# Patient Record
Sex: Female | Born: 1960 | Race: White | Hispanic: No | Marital: Married | Smoking: Former smoker
Health system: Southern US, Community
[De-identification: ages and names within clinical notes are randomized; demographics above are authoritative.]

## PROBLEM LIST (undated history)

## (undated) DIAGNOSIS — F32A Depression, unspecified: Secondary | ICD-10-CM

## (undated) DIAGNOSIS — F419 Anxiety disorder, unspecified: Secondary | ICD-10-CM

## (undated) DIAGNOSIS — T7840XA Allergy, unspecified, initial encounter: Secondary | ICD-10-CM

## (undated) DIAGNOSIS — R7611 Nonspecific reaction to tuberculin skin test without active tuberculosis: Secondary | ICD-10-CM

## (undated) DIAGNOSIS — M1990 Unspecified osteoarthritis, unspecified site: Secondary | ICD-10-CM

## (undated) DIAGNOSIS — F329 Major depressive disorder, single episode, unspecified: Secondary | ICD-10-CM

## (undated) DIAGNOSIS — D649 Anemia, unspecified: Secondary | ICD-10-CM

## (undated) DIAGNOSIS — R519 Headache, unspecified: Secondary | ICD-10-CM

## (undated) DIAGNOSIS — R51 Headache: Secondary | ICD-10-CM

## (undated) HISTORY — DX: Unspecified osteoarthritis, unspecified site: M19.90

## (undated) HISTORY — DX: Headache, unspecified: R51.9

## (undated) HISTORY — PX: OTHER SURGICAL HISTORY: SHX169

## (undated) HISTORY — DX: Headache: R51

## (undated) HISTORY — DX: Major depressive disorder, single episode, unspecified: F32.9

## (undated) HISTORY — DX: Allergy, unspecified, initial encounter: T78.40XA

## (undated) HISTORY — PX: WISDOM TOOTH EXTRACTION: SHX21

## (undated) HISTORY — DX: Anemia, unspecified: D64.9

## (undated) HISTORY — DX: Depression, unspecified: F32.A

## (undated) HISTORY — DX: Nonspecific reaction to tuberculin skin test without active tuberculosis: R76.11

## (undated) HISTORY — DX: Anxiety disorder, unspecified: F41.9

---
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
## 1995-01-17 HISTORY — PX: TUBAL LIGATION: SHX77

## 2002-04-29 ENCOUNTER — Other Ambulatory Visit: Admission: RE | Admit: 2002-04-29 | Discharge: 2002-04-29 | Payer: Self-pay | Admitting: Obstetrics and Gynecology

## 2003-12-08 ENCOUNTER — Emergency Department (HOSPITAL_COMMUNITY): Admission: EM | Admit: 2003-12-08 | Discharge: 2003-12-08 | Payer: Self-pay | Admitting: Emergency Medicine

## 2006-12-05 ENCOUNTER — Other Ambulatory Visit: Admission: RE | Admit: 2006-12-05 | Discharge: 2006-12-05 | Payer: Self-pay | Admitting: Gynecology

## 2010-12-20 ENCOUNTER — Other Ambulatory Visit: Payer: Self-pay | Admitting: Family Medicine

## 2010-12-20 ENCOUNTER — Other Ambulatory Visit (HOSPITAL_COMMUNITY)
Admission: RE | Admit: 2010-12-20 | Discharge: 2010-12-20 | Disposition: A | Discharge status: Home / Self Care | Payer: 59 | Source: Ambulatory Visit | Attending: Family Medicine | Admitting: Family Medicine

## 2010-12-20 DIAGNOSIS — Z124 Encounter for screening for malignant neoplasm of cervix: Secondary | ICD-10-CM | POA: Insufficient documentation

## 2012-02-15 ENCOUNTER — Other Ambulatory Visit: Payer: Self-pay | Admitting: Family Medicine

## 2012-02-15 ENCOUNTER — Other Ambulatory Visit (HOSPITAL_COMMUNITY)
Admission: RE | Admit: 2012-02-15 | Discharge: 2012-02-15 | Disposition: A | Discharge status: Home / Self Care | Payer: 59 | Source: Ambulatory Visit | Attending: Family Medicine | Admitting: Family Medicine

## 2012-02-15 DIAGNOSIS — Z124 Encounter for screening for malignant neoplasm of cervix: Secondary | ICD-10-CM | POA: Insufficient documentation

## 2013-04-03 ENCOUNTER — Ambulatory Visit
Admission: RE | Admit: 2013-04-03 | Discharge: 2013-04-03 | Disposition: A | Discharge status: Home / Self Care | Payer: 59 | Source: Ambulatory Visit | Attending: Family Medicine | Admitting: Family Medicine

## 2013-04-03 ENCOUNTER — Other Ambulatory Visit: Payer: Self-pay | Admitting: Family Medicine

## 2013-04-03 DIAGNOSIS — M25569 Pain in unspecified knee: Secondary | ICD-10-CM

## 2014-04-06 ENCOUNTER — Other Ambulatory Visit (HOSPITAL_COMMUNITY)
Admission: RE | Admit: 2014-04-06 | Discharge: 2014-04-06 | Disposition: A | Discharge status: Home / Self Care | Payer: 59 | Source: Ambulatory Visit | Attending: Family Medicine | Admitting: Family Medicine

## 2014-04-06 ENCOUNTER — Other Ambulatory Visit: Payer: Self-pay | Admitting: Family Medicine

## 2014-04-06 DIAGNOSIS — Z124 Encounter for screening for malignant neoplasm of cervix: Secondary | ICD-10-CM | POA: Diagnosis not present

## 2014-04-08 LAB — CYTOLOGY - PAP

## 2016-04-24 ENCOUNTER — Ambulatory Visit (INDEPENDENT_AMBULATORY_CARE_PROVIDER_SITE_OTHER): Payer: 59 | Admitting: Family Medicine

## 2016-04-24 ENCOUNTER — Encounter: Payer: Self-pay | Admitting: Family Medicine

## 2016-04-24 VITALS — BP 108/70 | HR 56 | Temp 97.8°F | Ht 66.0 in | Wt 173.8 lb

## 2016-04-24 DIAGNOSIS — F329 Major depressive disorder, single episode, unspecified: Secondary | ICD-10-CM

## 2016-04-24 DIAGNOSIS — F419 Anxiety disorder, unspecified: Principal | ICD-10-CM

## 2016-04-24 DIAGNOSIS — M1990 Unspecified osteoarthritis, unspecified site: Secondary | ICD-10-CM | POA: Insufficient documentation

## 2016-04-24 DIAGNOSIS — F418 Other specified anxiety disorders: Secondary | ICD-10-CM | POA: Diagnosis not present

## 2016-04-24 DIAGNOSIS — G43909 Migraine, unspecified, not intractable, without status migrainosus: Secondary | ICD-10-CM | POA: Insufficient documentation

## 2016-04-24 DIAGNOSIS — Z7689 Persons encountering health services in other specified circumstances: Secondary | ICD-10-CM | POA: Diagnosis not present

## 2016-04-24 NOTE — Patient Instructions (Addendum)
It was a pleasure to see you today! Please schedule a physical at your convenience and we will obtain lab work at that time. Also, please call for any refills needed prior to your physical.  Health Maintenance, Female Adopting a healthy lifestyle and getting preventive care can go a long way to promote health and wellness. Talk with your health care provider about what schedule of regular examinations is right for you. This is a good chance for you to check in with your provider about disease prevention and staying healthy. In between checkups, there are plenty of things you can do on your own. Experts have done a lot of research about which lifestyle changes and preventive measures are most likely to keep you healthy. Ask your health care provider for more information. Weight and diet Eat a healthy diet  Be sure to include plenty of vegetables, fruits, low-fat dairy products, and lean protein.  Do not eat a lot of foods high in solid fats, added sugars, or salt.  Get regular exercise. This is one of the most important things you can do for your health.  Most adults should exercise for at least 150 minutes each week. The exercise should increase your heart rate and make you sweat (moderate-intensity exercise).  Most adults should also do strengthening exercises at least twice a week. This is in addition to the moderate-intensity exercise. Maintain a healthy weight  Body mass index (BMI) is a measurement that can be used to identify possible weight problems. It estimates body fat based on height and weight. Your health care provider can help determine your BMI and help you achieve or maintain a healthy weight.  For females 9 years of age and older:  A BMI below 18.5 is considered underweight.  A BMI of 18.5 to 24.9 is normal.  A BMI of 25 to 29.9 is considered overweight.  A BMI of 30 and above is considered obese. Watch levels of cholesterol and blood lipids  You should start having  your blood tested for lipids and cholesterol at 55 years of age, then have this test every 5 years.  You may need to have your cholesterol levels checked more often if:  Your lipid or cholesterol levels are high.  You are older than 55 years of age.  You are at high risk for heart disease. Cancer screening Lung Cancer  Lung cancer screening is recommended for adults 92-25 years old who are at high risk for lung cancer because of a history of smoking.  A yearly low-dose CT scan of the lungs is recommended for people who:  Currently smoke.  Have quit within the past 15 years.  Have at least a 30-pack-year history of smoking. A pack year is smoking an average of one pack of cigarettes a day for 1 year.  Yearly screening should continue until it has been 15 years since you quit.  Yearly screening should stop if you develop a health problem that would prevent you from having lung cancer treatment. Breast Cancer  Practice breast self-awareness. This means understanding how your breasts normally appear and feel.  It also means doing regular breast self-exams. Let your health care provider know about any changes, no matter how small.  If you are in your 20s or 30s, you should have a clinical breast exam (CBE) by a health care provider every 1-3 years as part of a regular health exam.  If you are 83 or older, have a CBE every year. Also consider having  a breast X-ray (mammogram) every year.  If you have a family history of breast cancer, talk to your health care provider about genetic screening.  If you are at high risk for breast cancer, talk to your health care provider about having an MRI and a mammogram every year.  Breast cancer gene (BRCA) assessment is recommended for women who have family members with BRCA-related cancers. BRCA-related cancers include:  Breast.  Ovarian.  Tubal.  Peritoneal cancers.  Results of the assessment will determine the need for genetic  counseling and BRCA1 and BRCA2 testing. Cervical Cancer  Your health care provider may recommend that you be screened regularly for cancer of the pelvic organs (ovaries, uterus, and vagina). This screening involves a pelvic examination, including checking for microscopic changes to the surface of your cervix (Pap test). You may be encouraged to have this screening done every 3 years, beginning at age 10.  For women ages 48-65, health care providers may recommend pelvic exams and Pap testing every 3 years, or they may recommend the Pap and pelvic exam, combined with testing for human papilloma virus (HPV), every 5 years. Some types of HPV increase your risk of cervical cancer. Testing for HPV may also be done on women of any age with unclear Pap test results.  Other health care providers may not recommend any screening for nonpregnant women who are considered low risk for pelvic cancer and who do not have symptoms. Ask your health care provider if a screening pelvic exam is right for you.  If you have had past treatment for cervical cancer or a condition that could lead to cancer, you need Pap tests and screening for cancer for at least 20 years after your treatment. If Pap tests have been discontinued, your risk factors (such as having a new sexual partner) need to be reassessed to determine if screening should resume. Some women have medical problems that increase the chance of getting cervical cancer. In these cases, your health care provider may recommend more frequent screening and Pap tests. Colorectal Cancer  This type of cancer can be detected and often prevented.  Routine colorectal cancer screening usually begins at 55 years of age and continues through 55 years of age.  Your health care provider may recommend screening at an earlier age if you have risk factors for colon cancer.  Your health care provider may also recommend using home test kits to check for hidden blood in the stool.  A  small camera at the end of a tube can be used to examine your colon directly (sigmoidoscopy or colonoscopy). This is done to check for the earliest forms of colorectal cancer.  Routine screening usually begins at age 57.  Direct examination of the colon should be repeated every 5-10 years through 55 years of age. However, you may need to be screened more often if early forms of precancerous polyps or small growths are found. Skin Cancer  Check your skin from head to toe regularly.  Tell your health care provider about any new moles or changes in moles, especially if there is a change in a mole's shape or color.  Also tell your health care provider if you have a mole that is larger than the size of a pencil eraser.  Always use sunscreen. Apply sunscreen liberally and repeatedly throughout the day.  Protect yourself by wearing long sleeves, pants, a wide-brimmed hat, and sunglasses whenever you are outside. Heart disease, diabetes, and high blood pressure  High blood pressure  causes heart disease and increases the risk of stroke. High blood pressure is more likely to develop in:  People who have blood pressure in the high end of the normal range (130-139/85-89 mm Hg).  People who are overweight or obese.  People who are African American.  If you are 23-25 years of age, have your blood pressure checked every 3-5 years. If you are 58 years of age or older, have your blood pressure checked every year. You should have your blood pressure measured twice-once when you are at a hospital or clinic, and once when you are not at a hospital or clinic. Record the average of the two measurements. To check your blood pressure when you are not at a hospital or clinic, you can use:  An automated blood pressure machine at a pharmacy.  A home blood pressure monitor.  If you are between 46 years and 46 years old, ask your health care provider if you should take aspirin to prevent strokes.  Have regular  diabetes screenings. This involves taking a blood sample to check your fasting blood sugar level.  If you are at a normal weight and have a low risk for diabetes, have this test once every three years after 55 years of age.  If you are overweight and have a high risk for diabetes, consider being tested at a younger age or more often. Preventing infection Hepatitis B  If you have a higher risk for hepatitis B, you should be screened for this virus. You are considered at high risk for hepatitis B if:  You were born in a country where hepatitis B is common. Ask your health care provider which countries are considered high risk.  Your parents were born in a high-risk country, and you have not been immunized against hepatitis B (hepatitis B vaccine).  You have HIV or AIDS.  You use needles to inject street drugs.  You live with someone who has hepatitis B.  You have had sex with someone who has hepatitis B.  You get hemodialysis treatment.  You take certain medicines for conditions, including cancer, organ transplantation, and autoimmune conditions. Hepatitis C  Blood testing is recommended for:  Everyone born from [birth year] through 1964.  Anyone with known risk factors for hepatitis C. Sexually transmitted infections (STIs)  You should be screened for sexually transmitted infections (STIs) including gonorrhea and chlamydia if:  You are sexually active and are younger than 55 years of age.  You are older than 55 years of age and your health care provider tells you that you are at risk for this type of infection.  Your sexual activity has changed since you were last screened and you are at an increased risk for chlamydia or gonorrhea. Ask your health care provider if you are at risk.  If you do not have HIV, but are at risk, it may be recommended that you take a prescription medicine daily to prevent HIV infection. This is called pre-exposure prophylaxis (PrEP). You are considered at  risk if:  You are sexually active and do not regularly use condoms or know the HIV status of your partner(s).  You take drugs by injection.  You are sexually active with a partner who has HIV. Talk with your health care provider about whether you are at high risk of being infected with HIV. If you choose to begin PrEP, you should first be tested for HIV. You should then be tested every 3 months for as long as you are  taking PrEP. Pregnancy  If you are premenopausal and you may become pregnant, ask your health care provider about preconception counseling.  If you may become pregnant, take 400 to 800 micrograms (mcg) of folic acid every day.  If you want to prevent pregnancy, talk to your health care provider about birth control (contraception). Osteoporosis and menopause  Osteoporosis is a disease in which the bones lose minerals and strength with aging. This can result in serious bone fractures. Your risk for osteoporosis can be identified using a bone density scan.  If you are 21 years of age or older, or if you are at risk for osteoporosis and fractures, ask your health care provider if you should be screened.  Ask your health care provider whether you should take a calcium or vitamin D supplement to lower your risk for osteoporosis.  Menopause may have certain physical symptoms and risks.  Hormone replacement therapy may reduce some of these symptoms and risks. Talk to your health care provider about whether hormone replacement therapy is right for you. Follow these instructions at home:  Schedule regular health, dental, and eye exams.  Stay current with your immunizations.  Do not use any tobacco products including cigarettes, chewing tobacco, or electronic cigarettes.  If you are pregnant, do not drink alcohol.  If you are breastfeeding, limit how much and how often you drink alcohol.  Limit alcohol intake to no more than 1 drink per day for nonpregnant women. One drink  equals 12 ounces of beer, 5 ounces of wine, or 1 ounces of hard liquor.  Do not use street drugs.  Do not share needles.  Ask your health care provider for help if you need support or information about quitting drugs.  Tell your health care provider if you often feel depressed.  Tell your health care provider if you have ever been abused or do not feel safe at home. This information is not intended to replace advice given to you by your health care provider. Make sure you discuss any questions you have with your health care provider. Document Released: 07/18/2010 Document Revised: 06/10/2015 Document Reviewed: 10/06/2014 Elsevier Interactive Patient Education  2017 [place] NOW OFFER   [facility] FAST TRACK!!!  SAME DAY Appointments for ACUTE CARE  Such as: Sprains, Injuries, cuts, abrasions, rashes, muscle pain, joint pain, back pain Colds, flu, sore throats, headache, allergies, cough, fever  Ear pain, sinus and eye infections Abdominal pain, nausea, vomiting, diarrhea, upset stomach Animal/insect bites  3 Easy Ways to Schedule: Walk-In Scheduling Call in scheduling Mychart Sign-up: [email or web address]

## 2016-04-24 NOTE — Progress Notes (Signed)
Pre visit review using our clinic review tool, if applicable. No additional management support is needed unless otherwise documented below in the visit note. 

## 2016-04-24 NOTE — Progress Notes (Signed)
Patient ID: Sandra Mccarthy, female   DOB: November 05, 1960, 56 y.o.   MRN: [id number]  Patient presents to clinic today to establish care. She reports that she is interested in being healthy and would like to work on weight loss and have a physical in the near future.  Chronic Issues: Anxiety and depression which are controlled with escitalopram that she has been taking for approximately 6 years per patient. She denies changes in appetite, sleep, and denies any history of suicidal or homicidal ideation.   Arthritis has been treated with meloxicam as needed that has provided excellent benefit per patient.  She reports a remote history of diverticulitis however she did not remember treatment for this and reports that she has never had a colonoscopy.   History of frequent headaches however she denies any recent episodes of headaches or treatment.   Health Maintenance: Dental -- Twice yearly; UTD Vision -- Yearly; Lasik surgery and glasses with driving Immunizations -- She will obtain records for tetanus. No influenza vaccine at this time.   Colonoscopy -- Screening needed Mammogram -- Two years ago per patient; no abnormal findings PAP -- 2016  Diet and Exercise.  Modified heart healthy diet and she is working on weight loss; wears a fit bit and working to increase steps and initiate weight training.    Review of Systems  Constitutional: Negative for chills.  HENT: Negative for congestion.   Respiratory: Negative for cough, sputum production, shortness of breath and wheezing.   Cardiovascular: Negative for chest pain, palpitations and leg swelling.  Gastrointestinal: Negative for abdominal pain, blood in stool, constipation, diarrhea, heartburn, nausea and vomiting.  Genitourinary: Negative for dysuria, frequency, hematuria and urgency.  Skin: Negative for rash.  Neurological: Negative for dizziness, tingling, weakness and headaches.  Psychiatric/Behavioral:       Denies depressed or anxious  mood   Past Medical History:  Diagnosis Date  . Arthritis   . Depression   . Diverticulitis   . Frequent headaches   . 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 Urinary tract infection      Social History   Social History  . Marital status: Married    Spouse name: N/A  . Number of children: N/A  . Years of education: N/A   Occupational History  . Not on file.   Social History Main Topics  . Smoking status: Former Games developer  . Smokeless tobacco: Former Neurosurgeon  . Alcohol use Yes     Comment: occassional  . Drug use: No  . Sexual activity: Yes    Birth control/ protection: Post-menopausal   Other Topics Concern  . Not on file   Social History Narrative  . No narrative on file    Past Surgical History:  Procedure Laterality Date  . cystoscope Ivp Bilateral [id number]  . TUBAL LIGATION Bilateral 1997    Family History  Problem Relation Age of Onset  . Arthritis Mother   . Hypertension Father   . Diabetes Maternal Grandmother   . Birth defects Maternal Grandfather     No Known Allergies  No current outpatient prescriptions on file prior to visit.   No current facility-administered medications on file prior to visit.     BP 108/70 (BP Location: Left Arm, Patient Position: Sitting, Cuff Size: Normal)   Pulse (!) 56   Temp 97.8 F (36.6 C) (Oral)   Ht  (1.676 m)   Wt 173 lb 12.8 oz (78.8 kg)   LMP 03/24/2016 Comment:  tubal ligation  SpO2 97%   BMI 28.05 kg/m     BP 108/70 (BP Location: Left Arm, Patient Position: Sitting, Cuff Size: Normal)   Pulse (!) 56   Temp 97.8 F (36.6 C) (Oral)   Ht  (1.676 m)   Wt 173 lb 12.8 oz (78.8 kg)   LMP 03/24/2016 Comment:  tubal ligation  SpO2 97%   BMI 28.05 kg/m   Physical Exam  Constitutional: She is oriented to person, place, and time and well-developed, well-nourished, and in no distress.  Eyes: Pupils are equal, round, and reactive to light. No scleral icterus.  Neck: Neck supple.  Cardiovascular: Normal rate,  regular rhythm and intact distal pulses.   Pulmonary/Chest: Effort normal and breath sounds normal. She has no wheezes. She has no rales.  Abdominal: Soft. Bowel sounds are normal. There is no tenderness.  Musculoskeletal: She exhibits no edema.  Lymphadenopathy:    She has no cervical adenopathy.  Neurological: She is alert and oriented to person, place, and time. Gait normal. Coordination normal.  Skin: Skin is warm and dry. No rash noted.  Psychiatric: Mood, memory, affect and judgment normal.     Assessment/Plan:  1. Anxiety and depression Controlled; Continue escitalopram as directed;    2. Arthritis Meloxicam as needed; we discussed importance of obtaining lab work with physical for long term management is needed.   3. Encounter to establish care We reviewed the PMH, PSH, FH, SH, Meds and Allergies. -We provided refills for any medications we will prescribe as needed. -We addressed current concerns per orders and patient instructions. -We have asked for records for pertinent exams, studies, vaccines and notes from previous providers. -We have advised patient to follow up per instructions below.   -Patient advised to return or notify a provider immediately if symptoms worsen or persist or new concerns arise.  Recommend scheduling for physical and lab work for preventive care. Advised her that she is due for mammogram and screening for colon cancer.  Roddie Mc, FNP-C

## 2016-05-15 ENCOUNTER — Encounter: Payer: 59 | Admitting: Family Medicine

## 2016-05-15 NOTE — Progress Notes (Deleted)
   Subjective:    Patient ID: Sandra Mccarthy, female    DOB: October 25, 1960, 56 y.o.   MRN: [id number]  HPI  Sandra Mccarthy is a 56 year old female who is here for a yearly physical examination.  She has a history of arthritis and anxiety/depression. Depression and anxiety is controlled with escitalopram.  Her last Pap 03/2014  was normal. She is due for a mammogram and screening colonoscopy.   Diet:  Exercise:  Today, she reports    Review of past medical records indicates that she has had a Tdap 02/05/2012  Review of Systems     Objective:   Physical Exam        Assessment & Plan:

## 2016-05-17 ENCOUNTER — Ambulatory Visit (INDEPENDENT_AMBULATORY_CARE_PROVIDER_SITE_OTHER): Payer: 59 | Admitting: Family Medicine

## 2016-05-17 ENCOUNTER — Encounter: Payer: Self-pay | Admitting: Family Medicine

## 2016-05-17 VITALS — BP 98/70 | HR 70 | Temp 98.2°F | Ht 66.0 in | Wt 167.6 lb

## 2016-05-17 DIAGNOSIS — Z1231 Encounter for screening mammogram for malignant neoplasm of breast: Secondary | ICD-10-CM | POA: Diagnosis not present

## 2016-05-17 DIAGNOSIS — Z1211 Encounter for screening for malignant neoplasm of colon: Secondary | ICD-10-CM

## 2016-05-17 DIAGNOSIS — M1990 Unspecified osteoarthritis, unspecified site: Secondary | ICD-10-CM | POA: Diagnosis not present

## 2016-05-17 DIAGNOSIS — Z0000 Encounter for general adult medical examination without abnormal findings: Secondary | ICD-10-CM | POA: Diagnosis not present

## 2016-05-17 DIAGNOSIS — F329 Major depressive disorder, single episode, unspecified: Secondary | ICD-10-CM | POA: Diagnosis not present

## 2016-05-17 DIAGNOSIS — F419 Anxiety disorder, unspecified: Secondary | ICD-10-CM

## 2016-05-17 DIAGNOSIS — Z1239 Encounter for other screening for malignant neoplasm of breast: Secondary | ICD-10-CM

## 2016-05-17 DIAGNOSIS — Z23 Encounter for immunization: Secondary | ICD-10-CM | POA: Diagnosis not present

## 2016-05-17 LAB — LIPID PANEL
Cholesterol: 224 mg/dL — ABNORMAL HIGH (ref 0–200)
HDL: 67.7 mg/dL (ref >39.00)
LDL Cholesterol: 143 mg/dL — ABNORMAL HIGH (ref 0–99)
NonHDL: 156.02
Total CHOL/HDL Ratio: 3
Triglycerides: 66 mg/dL (ref 0.0–149.0)
VLDL: 13.2 mg/dL (ref 0.0–40.0)

## 2016-05-17 LAB — CBC WITH DIFFERENTIAL/PLATELET
Basophils Absolute: 0 10*3/uL (ref 0.0–0.1)
Basophils Relative: 0.2 % (ref 0.0–3.0)
Eosinophils Absolute: 0 10*3/uL (ref 0.0–0.7)
Eosinophils Relative: 0.4 % (ref 0.0–5.0)
HCT: 39.3 % (ref 36.0–46.0)
HEMOGLOBIN: 13.5 g/dL (ref 12.0–15.0)
LYMPHS PCT: 24.8 % (ref 12.0–46.0)
Lymphs Abs: 1.7 10*3/uL (ref 0.7–4.0)
MCHC: 34.3 g/dL (ref 30.0–36.0)
MCV: 92.8 fl (ref 78.0–100.0)
Monocytes Absolute: 0.4 10*3/uL (ref 0.1–1.0)
Monocytes Relative: 6.4 % (ref 3.0–12.0)
Neutro Abs: 4.8 10*3/uL (ref 1.4–7.7)
Neutrophils Relative %: 68.2 % (ref 43.0–77.0)
Platelets: 300 10*3/uL (ref 150.0–400.0)
RBC: 4.24 Mil/uL (ref 3.87–5.11)
RDW: 12.5 % (ref 11.5–15.5)
WBC: 7 10*3/uL (ref 4.0–10.5)

## 2016-05-17 LAB — BASIC METABOLIC PANEL
BUN: 19 mg/dL (ref 6–23)
CHLORIDE: 103 meq/L (ref 96–112)
CO2: 27 meq/L (ref 19–32)
Calcium: 9.7 mg/dL (ref 8.4–10.5)
Creatinine, Ser: 0.76 mg/dL (ref 0.40–1.20)
GFR: 84.08 mL/min (ref >60.00)
Glucose, Bld: 85 mg/dL (ref 70–99)
Potassium: 4.1 mEq/L (ref 3.5–5.1)
Sodium: 138 mEq/L (ref 135–145)

## 2016-05-17 LAB — URINALYSIS, ROUTINE W REFLEX MICROSCOPIC
BILIRUBIN URINE: NEGATIVE
Ketones, ur: NEGATIVE
Leukocytes, UA: NEGATIVE
NITRITE: NEGATIVE
Specific Gravity, Urine: 1.02 (ref 1.000–1.030)
TOTAL PROTEIN, URINE-UPE24: NEGATIVE
URINE GLUCOSE: NEGATIVE
Urobilinogen, UA: 0.2 (ref 0.0–1.0)
pH: 6.5 (ref 5.0–8.0)

## 2016-05-17 LAB — HEPATIC FUNCTION PANEL
ALBUMIN: 4.5 g/dL (ref 3.5–5.2)
ALT: 18 U/L (ref 0–35)
AST: 22 U/L (ref 0–37)
Alkaline Phosphatase: 74 U/L (ref 39–117)
Bilirubin, Direct: 0.2 mg/dL (ref 0.0–0.3)
TOTAL PROTEIN: 7 g/dL (ref 6.0–8.3)
Total Bilirubin: 0.9 mg/dL (ref 0.2–1.2)

## 2016-05-17 LAB — TSH: TSH: 0.91 u[IU]/mL (ref 0.35–4.50)

## 2016-05-17 LAB — HEMOGLOBIN A1C: HEMOGLOBIN A1C: 5.4 % (ref 4.6–6.5)

## 2016-05-17 NOTE — Patient Instructions (Addendum)
It was a pleasure to see you today!  Keep up the great work with diet and exercise!  We have ordered labs or studies at this visit. It can take up to 1-2 weeks for results and processing. IF results require follow up or explanation, we will call you with instructions. Clinically stable results will be released to your [facility]. If you have not heard from [place] or cannot find your results in [facility] in 2 weeks please contact our office at [phone].  If you are not yet signed up for [facility], please consider signing up   Health Maintenance, Female Adopting a healthy lifestyle and getting preventive care can go a long way to promote health and wellness. Talk with your health care provider about what schedule of regular examinations is right for you. This is a good chance for you to check in with your provider about disease prevention and staying healthy. In between checkups, there are plenty of things you can do on your own. Experts have done a lot of research about which lifestyle changes and preventive measures are most likely to keep you healthy. Ask your health care provider for more information. Weight and diet Eat a healthy diet  Be sure to include plenty of vegetables, fruits, low-fat dairy products, and lean protein.  Do not eat a lot of foods high in solid fats, added sugars, or salt.  Get regular exercise. This is one of the most important things you can do for your health.  Most adults should exercise for at least 150 minutes each week. The exercise should increase your heart rate and make you sweat (moderate-intensity exercise).  Most adults should also do strengthening exercises at least twice a week. This is in addition to the moderate-intensity exercise. Maintain a healthy weight  Body mass index (BMI) is a measurement that can be used to identify possible weight problems. It estimates body fat based on height and weight. Your health care provider can help determine your BMI and help  you achieve or maintain a healthy weight.  For females 30 years of age and older:  A BMI below 18.5 is considered underweight.  A BMI of 18.5 to 24.9 is normal.  A BMI of 25 to 29.9 is considered overweight.  A BMI of 30 and above is considered obese. Watch levels of cholesterol and blood lipids  You should start having your blood tested for lipids and cholesterol at 54 years of age, then have this test every 5 years.  You may need to have your cholesterol levels checked more often if:  Your lipid or cholesterol levels are high.  You are older than 55 years of age.  You are at high risk for heart disease. Cancer screening Lung Cancer  Lung cancer screening is recommended for adults 60-59 years old who are at high risk for lung cancer because of a history of smoking.  A yearly low-dose CT scan of the lungs is recommended for people who:  Currently smoke.  Have quit within the past 15 years.  Have at least a 30-pack-year history of smoking. A pack year is smoking an average of one pack of cigarettes a day for 1 year.  Yearly screening should continue until it has been 15 years since you quit.  Yearly screening should stop if you develop a health problem that would prevent you from having lung cancer treatment. Breast Cancer  Practice breast self-awareness. This means understanding how your breasts normally appear and feel.  It also means  doing regular breast self-exams. Let your health care provider know about any changes, no matter how small.  If you are in your 20s or 30s, you should have a clinical breast exam (CBE) by a health care provider every 1-3 years as part of a regular health exam.  If you are 32 or older, have a CBE every year. Also consider having a breast X-ray (mammogram) every year.  If you have a family history of breast cancer, talk to your health care provider about genetic screening.  If you are at high risk for breast cancer, talk to your health  care provider about having an MRI and a mammogram every year.  Breast cancer gene (BRCA) assessment is recommended for women who have family members with BRCA-related cancers. BRCA-related cancers include:  Breast.  Ovarian.  Tubal.  Peritoneal cancers.  Results of the assessment will determine the need for genetic counseling and BRCA1 and BRCA2 testing. Cervical Cancer  Your health care provider may recommend that you be screened regularly for cancer of the pelvic organs (ovaries, uterus, and vagina). This screening involves a pelvic examination, including checking for microscopic changes to the surface of your cervix (Pap test). You may be encouraged to have this screening done every 3 years, beginning at age 61.  For women ages 18-65, health care providers may recommend pelvic exams and Pap testing every 3 years, or they may recommend the Pap and pelvic exam, combined with testing for human papilloma virus (HPV), every 5 years. Some types of HPV increase your risk of cervical cancer. Testing for HPV may also be done on women of any age with unclear Pap test results.  Other health care providers may not recommend any screening for nonpregnant women who are considered low risk for pelvic cancer and who do not have symptoms. Ask your health care provider if a screening pelvic exam is right for you.  If you have had past treatment for cervical cancer or a condition that could lead to cancer, you need Pap tests and screening for cancer for at least 20 years after your treatment. If Pap tests have been discontinued, your risk factors (such as having a new sexual partner) need to be reassessed to determine if screening should resume. Some women have medical problems that increase the chance of getting cervical cancer. In these cases, your health care provider may recommend more frequent screening and Pap tests. Colorectal Cancer  This type of cancer can be detected and often prevented.  Routine  colorectal cancer screening usually begins at 55 years of age and continues through 55 years of age.  Your health care provider may recommend screening at an earlier age if you have risk factors for colon cancer.  Your health care provider may also recommend using home test kits to check for hidden blood in the stool.  A small camera at the end of a tube can be used to examine your colon directly (sigmoidoscopy or colonoscopy). This is done to check for the earliest forms of colorectal cancer.  Routine screening usually begins at age 21.  Direct examination of the colon should be repeated every 5-10 years through 55 years of age. However, you may need to be screened more often if early forms of precancerous polyps or small growths are found. Skin Cancer  Check your skin from head to toe regularly.  Tell your health care provider about any new moles or changes in moles, especially if there is a change in a mole's shape  or color.  Also tell your health care provider if you have a mole that is larger than the size of a pencil eraser.  Always use sunscreen. Apply sunscreen liberally and repeatedly throughout the day.  Protect yourself by wearing long sleeves, pants, a wide-brimmed hat, and sunglasses whenever you are outside. Heart disease, diabetes, and high blood pressure  High blood pressure causes heart disease and increases the risk of stroke. High blood pressure is more likely to develop in:  People who have blood pressure in the high end of the normal range (130-139/85-89 mm Hg).  People who are overweight or obese.  People who are African American.  If you are 56-25 years of age, have your blood pressure checked every 3-5 years. If you are 22 years of age or older, have your blood pressure checked every year. You should have your blood pressure measured twice-once when you are at a hospital or clinic, and once when you are not at a hospital or clinic. Record the average of the two  measurements. To check your blood pressure when you are not at a hospital or clinic, you can use:  An automated blood pressure machine at a pharmacy.  A home blood pressure monitor.  If you are between 32 years and 72 years old, ask your health care provider if you should take aspirin to prevent strokes.  Have regular diabetes screenings. This involves taking a blood sample to check your fasting blood sugar level.  If you are at a normal weight and have a low risk for diabetes, have this test once every three years after 55 years of age.  If you are overweight and have a high risk for diabetes, consider being tested at a younger age or more often. Preventing infection Hepatitis B  If you have a higher risk for hepatitis B, you should be screened for this virus. You are considered at high risk for hepatitis B if:  You were born in a country where hepatitis B is common. Ask your health care provider which countries are considered high risk.  Your parents were born in a high-risk country, and you have not been immunized against hepatitis B (hepatitis B vaccine).  You have HIV or AIDS.  You use needles to inject street drugs.  You live with someone who has hepatitis B.  You have had sex with someone who has hepatitis B.  You get hemodialysis treatment.  You take certain medicines for conditions, including cancer, organ transplantation, and autoimmune conditions. Hepatitis C  Blood testing is recommended for:  Everyone born from [birth year] through 1964.  Anyone with known risk factors for hepatitis C. Sexually transmitted infections (STIs)  You should be screened for sexually transmitted infections (STIs) including gonorrhea and chlamydia if:  You are sexually active and are younger than 55 years of age.  You are older than 55 years of age and your health care provider tells you that you are at risk for this type of infection.  Your sexual activity has changed since you were last  screened and you are at an increased risk for chlamydia or gonorrhea. Ask your health care provider if you are at risk.  If you do not have HIV, but are at risk, it may be recommended that you take a prescription medicine daily to prevent HIV infection. This is called pre-exposure prophylaxis (PrEP). You are considered at risk if:  You are sexually active and do not regularly use condoms or know the HIV status of  your partner(s).  You take drugs by injection.  You are sexually active with a partner who has HIV. Talk with your health care provider about whether you are at high risk of being infected with HIV. If you choose to begin PrEP, you should first be tested for HIV. You should then be tested every 3 months for as long as you are taking PrEP. Pregnancy  If you are premenopausal and you may become pregnant, ask your health care provider about preconception counseling.  If you may become pregnant, take 400 to 800 micrograms (mcg) of folic acid every day.  If you want to prevent pregnancy, talk to your health care provider about birth control (contraception). Osteoporosis and menopause  Osteoporosis is a disease in which the bones lose minerals and strength with aging. This can result in serious bone fractures. Your risk for osteoporosis can be identified using a bone density scan.  If you are 45 years of age or older, or if you are at risk for osteoporosis and fractures, ask your health care provider if you should be screened.  Ask your health care provider whether you should take a calcium or vitamin D supplement to lower your risk for osteoporosis.  Menopause may have certain physical symptoms and risks.  Hormone replacement therapy may reduce some of these symptoms and risks. Talk to your health care provider about whether hormone replacement therapy is right for you. Follow these instructions at home:  Schedule regular health, dental, and eye exams.  Stay current with your  immunizations.  Do not use any tobacco products including cigarettes, chewing tobacco, or electronic cigarettes.  If you are pregnant, do not drink alcohol.  If you are breastfeeding, limit how much and how often you drink alcohol.  Limit alcohol intake to no more than 1 drink per day for nonpregnant women. One drink equals 12 ounces of beer, 5 ounces of wine, or 1 ounces of hard liquor.  Do not use street drugs.  Do not share needles.  Ask your health care provider for help if you need support or information about quitting drugs.  Tell your health care provider if you often feel depressed.  Tell your health care provider if you have ever been abused or do not feel safe at home. This information is not intended to replace advice given to you by your health care provider. Make sure you discuss any questions you have with your health care provider. Document Released: 07/18/2010 Document Revised: 06/10/2015 Document Reviewed: 10/06/2014 Elsevier Interactive Patient Education  2017 [employer]ER   [facility] FAST TRACK!!!  SAME DAY Appointments for ACUTE CARE  Such as: Sprains, Injuries, cuts, abrasions, rashes, muscle pain, joint pain, back pain Colds, flu, sore throats, headache, allergies, cough, fever  Ear pain, sinus and eye infections Abdominal pain, nausea, vomiting, diarrhea, upset stomach Animal/insect bites  3 Easy Ways to Schedule: Walk-In Scheduling Call in scheduling Mychart Sign-up: [email or web address]

## 2016-05-17 NOTE — Progress Notes (Signed)
Subjective:    Patient ID: Sandra Mccarthy, female    DOB: 1960/08/09, 56 y.o.   MRN: [id number]  HPI  Ms. Goldin is a 56 year old female who is here today for a routine physical examination.    History of anxiety/depression that is controlled with escitalopram which she has been taking for 6 years.  Arthritis which has been treated intermittently with meloxicam that has provided great benefit.  Last Pap was in 2016 with no abnormal findings.    She reports recently using a tanning bed prior to a cruise; she has not done this previously. She does report sun exposure and states that she monitors her skin on a regular basis.  Diet: She is focused on improving her diet.  No red meat and she is following a modified heart healthy diet.  She monitors food intake and documents this on her fit bit.    Exercise:  Walking 10 K steps a day.  She denies cardiopulmonary symptoms with exercise.   She is due for a screening colonoscopy and mammography at this time.   Review of Systems Constitutional: No fever, chills, significant weight change, fatigue, weakness or night sweats Eyes: No redness, discharge, pain, blurred vision, double vision, or loss of vision ENT/mouth: No nasal congestion, postnasal drainage,epistaxis, purulent discharge, earache, hearing loss, tinnitus ,sore throat , dental pain, or hoarseness   Cardiovascular: no chest pain, palpitations, racing, irregular rhythm, syncope, nausea, sweating, claudication, or edema  Respiratory: No cough, sputum production,hemoptysis,  dyspnea, paroxysmal nocturnal dyspnea, pleuritic chest pain, significant snoring, or  apnea    Gastrointestinal: No heartburn,dysphagia, nausea and vomiting,ominal pain, change in bowels, anorexia, diarrhea, significant constipation, rectal bleeding, melena,  stool incontinence or jaundice Genitourinary: No dysuria,hematuria, pyuria, frequency, urgency,  incontinence, nocturia, dark urine or flank  pain Musculoskeletal: No myalgias or muscle cramping, joint stiffness, joint swelling, joint color change, weakness, or cyanosis Dermatologic: No rash, pruritus, urticaria, or change in color or temperature of skin Neurologic: No headache, vertigo, limb weakness, tremor, gait disturbance, seizures, memory loss, numbness or tingling Psychiatric: No significant anxiety or depression, anhedonia, panic attacks, insomnia, or anorexia Endocrine: No change in hair/skin/ nails, excessive thirst, excessive hunger, excessive urination, or unexplained fatigue Hematologic/lymphatic: No bruising, lymphadenopathy,or  abnormal clotting Allergy/immunology: No itchy/ watery eyes, abnormal sneezing, rhinitis, urticaria ,or angioedema     Objective:   Physical Exam  Physical Exam  Constitutional: She is oriented to person, place, and time. She appears well-developed and well-nourished. No distress.  HENT:  Head: Normocephalic and atraumatic.  Right Ear: Tympanic membrane and ear canal normal.  Left Ear: Tympanic membrane and ear canal normal.  Mouth/Throat: Oropharynx is clear and moist.  Eyes: Pupils are equal, round, and reactive to light. No scleral icterus.  Neck: Normal range of motion. No thyromegaly present.  Cardiovascular: Normal rate and regular rhythm.   No murmur heard. Pulmonary/Chest: Effort normal and breath sounds normal. No respiratory distress. He has no wheezes. She has no rales. She exhibits no tenderness.  Abdominal: Soft. Bowel sounds are normal. She exhibits no distension and no mass. There is no tenderness. There is no rebound and no guarding.  Musculoskeletal: She exhibits no edema.  Lymphadenopathy:    She has no cervical adenopathy.  Neurological: She is alert and oriented to person, place, and time. She has normal patellar reflexes. She exhibits normal muscle tone. Coordination normal.  Skin: Skin is warm and dry.  Psychiatric: She has a normal mood and affect. Her behavior  is  normal. Judgment and thought content normal.  Gyn deferred: Patient preferred; Next Pap due 2019       Assessment & Plan:  1. Routine general medical examination at a health care facility 56 y.o. female presenting for annual physical.  Health Maintenance counseling: 1. Anticipatory guidance: Patient counseled regarding regular dental exams, eye exams, wearing seatbelts.  2. Risk factor reduction:  Advised patient of need for regular exercise and diet rich and fruits and vegetables to reduce risk of heart attack and stroke.  3. Immunizations/screenings/ancillary studies:  Tdap today,   There is no immunization history on file for this patient. Health Maintenance Due  Topic Date Due  . Hepatitis C Screening  August 05, 1960  . HIV Screening  09/09/1976  . TETANUS/TDAP  09/09/1980  . COLONOSCOPY  09/10/2011  . MAMMOGRAM  04/15/2016   4. Cervical cancer screening- Last Pap normal in 2016; due in 2019 5. Breast cancer screening-  Referral for screening mammography provided 6. Colon cancer screening - Referral for screening colonoscopy provided today 7. Skin cancer screening- Advised patient to avoid tanning beds; she will substitute tanning beds for tanning lotion. We reviewed the ABCDEs of lesion assessment. No concerning lesions reported by patient today. - Hep C Antibody - Urinalysis - CBC with Differential/Platelet - Basic metabolic panel - Lipid panel - TSH - Hepatic function panel - Hemoglobin A1c - Hep C Antibody  2. Anxiety and depression Controlled; continue escitalopram 20 mg daily  3. Arthritis Controlled; advised exercises such as free style swimming for exercise; she has meloxicam from another provider that provides benefit when needed  4. Screening for breast cancer  - MM Digital Screening; Future  5. Screening for colon cancer  - Ambulatory referral to Gastroenterology  Follow up in one year or sooner if needed.  Roddie Mc, FNP-C

## 2016-05-18 LAB — HEPATITIS C ANTIBODY: HCV Ab: NEGATIVE

## 2016-06-19 ENCOUNTER — Encounter: Payer: Self-pay | Admitting: Family Medicine

## 2016-10-23 ENCOUNTER — Ambulatory Visit (INDEPENDENT_AMBULATORY_CARE_PROVIDER_SITE_OTHER): Payer: 59 | Admitting: Family Medicine

## 2016-10-23 ENCOUNTER — Encounter: Payer: Self-pay | Admitting: Family Medicine

## 2016-10-23 VITALS — BP 112/82 | HR 68 | Temp 97.9°F | Wt 172.8 lb

## 2016-10-23 DIAGNOSIS — Z1211 Encounter for screening for malignant neoplasm of colon: Secondary | ICD-10-CM

## 2016-10-23 DIAGNOSIS — Z1231 Encounter for screening mammogram for malignant neoplasm of breast: Secondary | ICD-10-CM | POA: Diagnosis not present

## 2016-10-23 DIAGNOSIS — L739 Follicular disorder, unspecified: Secondary | ICD-10-CM | POA: Diagnosis not present

## 2016-10-23 MED ORDER — SULFAMETHOXAZOLE-TRIMETHOPRIM 800-160 MG PO TABS: 1.0000 | ORAL_TABLET | Freq: Two times a day (BID) | ORAL | 0 refills | Status: AC

## 2016-10-23 NOTE — Patient Instructions (Addendum)
Folliculitis Folliculitis is inflammation of the hair follicles. Folliculitis most commonly occurs on the scalp, thighs, legs, back, and buttocks. However, it can occur anywhere on the body. What are the causes? This condition may be caused by:  A bacterial infection (common).  A fungal infection.  A viral infection.  Coming into contact with certain chemicals, especially oils and tars.  Shaving or waxing.  Applying greasy ointments or creams to your skin often.  Long-lasting folliculitis and folliculitis that keeps coming back can be caused by bacteria that live in the nostrils. What increases the risk? This condition is more likely to develop in people with:  A weakened immune system.  Diabetes.  Obesity.  What are the signs or symptoms? Symptoms of this condition include:  Redness.  Soreness.  Swelling.  Itching.  Small white or yellow, pus-filled, itchy spots (pustules) that appear over a reddened area. If there is an infection that goes deep into the follicle, these may develop into a boil (furuncle).  A group of closely packed boils (carbuncle). These tend to form in hairy, sweaty areas of the body.  How is this diagnosed? This condition is diagnosed with a skin exam. To find what is causing the condition, your health care provider may take a sample of one of the pustules or boils for testing. How is this treated? This condition may be treated by:  Applying warm compresses to the affected areas.  Taking an antibiotic medicine or applying an antibiotic medicine to the skin.  Applying or bathing with an antiseptic solution.  Taking an over-the-counter medicine to help with itching.  Having a procedure to drain any pustules or boils. This may be done if a pustule or boil contains a lot of pus or fluid.  Laser hair removal. This may be done to treat long-lasting folliculitis.  Follow these instructions at home:  If directed, apply heat to the affected  area as often as told by your health care provider. Use the heat source that your health care provider recommends, such as a moist heat pack or a heating pad. ? Place a towel between your skin and the heat source. ? Leave the heat on for 20-30 minutes. ? Remove the heat if your skin turns bright red. This is especially important if you are unable to feel pain, heat, or cold. You may have a greater risk of getting burned.  If you were prescribed an antibiotic medicine, use it as told by your health care provider. Do not stop using the antibiotic even if you start to feel better.  Take over-the-counter and prescription medicines only as told by your health care provider.  Do not shave irritated skin.  Keep all follow-up visits as told by your health care provider. This is important. Get help right away if:  You have more redness, swelling, or pain in the affected area.  Red streaks are spreading from the affected area.  You have a fever. This information is not intended to replace advice given to you by your health care provider. Make sure you discuss any questions you have with your health care provider. Document Released: 03/13/2001 Document Revised: 07/23/2015 Document Reviewed: 10/23/2014 Elsevier Interactive Patient Education  2018 [employer].  Urinary Frequency, Adult Urinary frequency means urinating more often than usual. People with urinary frequency urinate at least 8 times in 24 hours, even if they drink a normal amount of fluid. Although they urinate more often than normal, the total amount of urine produced in a  day may be normal. Urinary frequency is also called pollakiuria. What are the causes? This condition may be caused by:  A urinary tract infection.  Obesity.  Bladder problems, such as bladder stones.  Caffeine or alcohol.  Eating food or drinking fluids that irritate the bladder. These include coffee, tea, soda, artificial sweeteners, citrus, tomato-based  foods, and chocolate.  Certain medicines, such as medicines that help the body get rid of extra fluid (diuretics).  Muscle or nerve weakness.  Overactive bladder.  Chronic diabetes.  Interstitial cystitis.  In men, problems with the prostate, such as an enlarged prostate.  In women, pregnancy.  In some cases, the cause may not be known. What increases the risk? This condition is more likely to develop in:  Women who have gone through menopause.  Men with prostate problems.  People with a disease or injury that affects the nerves or spinal cord.  People who have or have had a condition that affects the brain, such as a stroke.  What are the signs or symptoms? Symptoms of this condition include:  Feeling an urgent need to urinate often. The stress and anxiety of needing to find a bathroom quickly can make this urge worse.  Urinating 8 or more times in 24 hours.  Urinating as often as every 1 to 2 hours.  How is this diagnosed? This condition is diagnosed based on your symptoms, your medical history, and a physical exam. You may have tests, such as:  Blood tests.  Urine tests.  Imaging tests, such as X-rays or ultrasounds.  A bladder test.  A test of your neurological system. This is the body system that senses the need to urinate.  A test to check for problems in the urethra and bladder called cystoscopy.  You may also be asked to keep a bladder diary. A bladder diary is a record of what you eat and drink, how often you urinate, and how much you urinate. You may need to see a health care provider who specializes in conditions of the urinary tract (urologist) or kidneys (nephrologist). How is this treated? Treatment for this condition depends on the cause. Sometimes the condition goes away on its own and treatment is not necessary. If treatment is needed, it may include:  Taking medicine.  Learning exercises that strengthen the muscles that help control  urination.  Following a bladder training program. This may include: ? Learning to delay going to the bathroom. ? Double urinating (voiding). This helps if you are not completely emptying your bladder. ? Scheduled voiding.  Making diet changes, such as: ? Avoiding caffeine. ? Drinking fewer fluids, especially alcohol. ? Not drinking in the evening. ? Not having foods or drinks that may irritate the bladder. ? Eating foods that help prevent or ease constipation. Constipation can make this condition worse.  Having the nerves in your bladder stimulated. There are two options for stimulating the nerves to your bladder: ? Outpatient electrical nerve stimulation. This is done by your health care provider. ? Surgery to implant a bladder pacemaker. The pacemaker helps to control the urge to urinate.  Follow these instructions at home:  Keep a bladder diary if told to by your health care provider.  Take over-the-counter and prescription medicines only as told by your health care provider.  Do any exercises as told by your health care provider.  Follow a bladder training program as told by your health care provider.  Make any recommended diet changes.  Keep all follow-up visits  as told by your health care provider. This is important. Contact a health care provider if:  You start urinating more often.  You feel pain or irritation when you urinate.  You notice blood in your urine.  Your urine looks cloudy.  You develop a fever.  You begin vomiting. Get help right away if:  You are unable to urinate. This information is not intended to replace advice given to you by your health care provider. Make sure you discuss any questions you have with your health care provider. Document Released: 10/29/2008 Document Revised: 02/03/2015 Document Reviewed: 07/29/2014 Elsevier Interactive Patient Education  [employer].

## 2016-10-23 NOTE — Progress Notes (Signed)
Subjective:    Patient ID: Sandra Mccarthy, female    DOB: [birth year], 56 y.o.   MRN: [id number]  Chief Complaint  Patient presents with  . Breast Problem    HPI Patient was seen today for acute issue.  Healing bump on L breast.  Pt would like a referral for a colonoscopy.  Breast bump: -base of L breast with bump x 6 wks -pt tried to schedule a health maintenance mammogram, but was told she needed to have a diagnotic mammogram ordered by a provider. -bump was similar to a pimple, had some drainage, but now appears to be healing -pt denies nipple d/c, nipple retraction, breast asymmetry, cellulitis  Bumps on buttocks: -pt endorses several bumps in various stages -initially start as a large painful red area, then become a pimple head. -pt has put toothpaste on the areas to make the" head occur" faster. -Less sore when the pimple head is present.  Pt also mentions issues with urinary frequency.  Pt had 6 kids via SVD.  Past Medical History:  Diagnosis Date  . Arthritis   . Depression   . Diverticulitis   . Frequent headaches   . 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 Urinary tract infection     Past Surgical History:  Procedure Laterality Date  . cystoscope Ivp Bilateral [id number]  . TUBAL LIGATION Bilateral 1997    Family History  Problem Relation Age of Onset  . Arthritis Mother   . Hypertension Father   . Diabetes Maternal Grandmother   . Birth defects Maternal Grandfather     Social History   Social History  . Marital status: Married    Spouse name: N/A  . Number of children: N/A  . Years of education: N/A   Occupational History  . Not on file.   Social History Main Topics  . Smoking status: Former Smoker    Packs/day: 1.00    Years: 10.00    Quit date: 05/18/1998  . Smokeless tobacco: Former Neurosurgeon  . Alcohol use 1.2 oz/week    2 Glasses of wine per week     Comment: occassional  . Drug use: No  . Sexual activity: Yes    Birth control/ protection: Post-menopausal    Other Topics Concern  . Not on file   Social History Narrative   Has 6 daughters; 2 are twins graduated from [employer]    Outpatient Medications Prior to Visit  Medication Sig Dispense Refill  . Biotin 800 MCG TABS Take 1 tablet by mouth daily.    Marland Kitchen escitalopram (LEXAPRO) 20 MG tablet Take 20 mg by mouth daily.    . meloxicam (MOBIC) 15 MG tablet Take 15 mg by mouth daily.    . Nutritional Supplements (ESTROVEN WEIGHT MANAGEMENT) CAPS Take 1 capsule by mouth every other day.    Theodoro Grist COQ10/UBIQUINOL/MEGA PO Take 100 mLs by mouth daily.      No facility-administered medications prior to visit.     No Known Allergies  ROS General: Denies fever, chills, night sweats, changes in weight, changes in appetite HEENT: Denies headaches, ear pain, changes in vision, rhinorrhea, sore throat CV: Denies CP, palpitations, SOB, orthopnea Pulm: Denies SOB, cough, wheezing GI: Denies abdominal pain, nausea, vomiting, diarrhea, constipation GU: Denies dysuria, hematuria, vaginal discharge   +urinary frequency Msk: Denies muscle cramps, joint pains Neuro: Denies weakness, numbness, tingling Skin: Denies rashes, bruising  +bumps on buttocks and L breast. Psych: Denies depression, anxiety, hallucinations  Objective:    Blood pressure 112/82, pulse 68, temperature 97.9 F (36.6 C), temperature source Oral, weight 172 lb 12.8 oz (78.4 kg), last menstrual period 03/24/2016.   Gen. Pleasant, well-nourished, in no distress, normal affect  HEENT: [place]/AT, face symmetric, no scleral icterus, PERRLA Lungs: no accessory muscle use, CTAB, no wheezes or rales Cardiovascular: RRR, no m/r/g, no peripheral edema Musculoskeletal: No deformities, normal tone Neuro:  A&Ox3, CN II-XII intact, normal gait Skin:  Warm, dry, intact.  Raised mole on R lateral side of neck with a flat hyperpigmented plaque superior to it.  L lower medial breast with healing flat area with 2 small punctate  lesions in the center of it, no drainge, no peau de orange, no mass.  L buttock with small pinpoint pustule with erythema, similar pinpoint pustule on R side of Gluteal fold and on L upper thigh near Labia Majora.   Wt Readings from Last 3 Encounters:  10/23/16 172 lb 12.8 oz (78.4 kg)  05/17/16 167 lb 9.6 oz (76 kg)  04/24/16 173 lb 12.8 oz (78.8 kg)     Assessment/Plan:  Folliculitis  -given handout -discussed hygeine -Given RTC precautions - Plan: sulfamethoxazole-trimethoprim (BACTRIM DS,SEPTRA DS) 800-160 MG tablet  Colon cancer screening  - Plan: [facility] COLONOSCOPY  Breast cancer screening by mammogram  - Plan: MM Digital Diagnostic Bilat   Will have pt f/u for urinary concern as was not able to evaluate for prolapse, etc.

## 2016-10-31 ENCOUNTER — Other Ambulatory Visit: Payer: Self-pay | Admitting: Family Medicine

## 2016-10-31 DIAGNOSIS — R921 Mammographic calcification found on diagnostic imaging of breast: Secondary | ICD-10-CM

## 2016-11-08 ENCOUNTER — Ambulatory Visit
Admission: RE | Admit: 2016-11-08 | Discharge: 2016-11-08 | Disposition: A | Discharge status: Home / Self Care | Payer: 59 | Source: Ambulatory Visit | Attending: Family Medicine | Admitting: Family Medicine

## 2016-11-08 DIAGNOSIS — R921 Mammographic calcification found on diagnostic imaging of breast: Secondary | ICD-10-CM | POA: Diagnosis not present

## 2016-11-14 ENCOUNTER — Encounter: Payer: Self-pay | Admitting: Emergency Medicine

## 2016-12-20 ENCOUNTER — Telehealth: Payer: Self-pay | Admitting: Family Medicine

## 2016-12-20 NOTE — Telephone Encounter (Signed)
Copied from CRM [id number]. Topic: Referral - Request >> Dec 20, 2016 12:20 PM Clack, Princella PellegriniJessica D wrote: Reason for CRM: Pt would like a referral for a colonoscopy.  Pt states that this referral was suppose to have been put in at her last OV in October.

## 2016-12-22 ENCOUNTER — Other Ambulatory Visit: Payer: Self-pay | Admitting: Emergency Medicine

## 2016-12-22 DIAGNOSIS — Z1211 Encounter for screening for malignant neoplasm of colon: Secondary | ICD-10-CM

## 2016-12-22 NOTE — Telephone Encounter (Signed)
Referral has been placed for patient colonoscopy. Patient is aware. Nothing further needed

## 2016-12-22 NOTE — Telephone Encounter (Signed)
Pt saw Dr Salomon FickBanks

## 2016-12-29 ENCOUNTER — Encounter: Payer: Self-pay | Admitting: Gastroenterology

## 2016-12-29 ENCOUNTER — Encounter: Payer: Self-pay | Admitting: Family Medicine

## 2016-12-29 ENCOUNTER — Ambulatory Visit: Payer: 59 | Admitting: Family Medicine

## 2016-12-29 VITALS — BP 102/76 | HR 98 | Temp 98.3°F | Wt 181.0 lb

## 2016-12-29 DIAGNOSIS — M1990 Unspecified osteoarthritis, unspecified site: Secondary | ICD-10-CM

## 2016-12-29 MED ORDER — MELOXICAM 15 MG PO TABS: 15.0000 mg | ORAL_TABLET | Freq: Every day | ORAL | 0 refills | Status: DC

## 2016-12-29 NOTE — Progress Notes (Signed)
Subjective:    Patient ID: Sandra Mccarthy, female    DOB: 1960-05-16, 56 y.o.   MRN: [id number]  Chief Complaint  Patient presents with  . Medication Refill    HPI Patient was seen today for medication refill.  Pt is here for refill on Mobic.  Pt takes Mobic 15 mg daily for arthritis and back low back pain.  Pt states it is a dull constant pain/discomfort in her back.  Pt has been taking Ibuprofen for this pain since she has been out of Mobic x 1 wk.  [facility], but that appointment is in January.  Pt also mentions she still has a bump on her L breast.  Pt was previously seen for this in Oct.  At that time it was noted to be healing.  Pt has had a mammogram this yr.  Past Medical History:  Diagnosis Date  . Arthritis   . Depression   . Diverticulitis   . Frequent headaches   . 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 Urinary tract infection     No Known Allergies  ROS General: Denies fever, chills, night sweats, changes in weight, changes in appetite HEENT: Denies headaches, ear pain, changes in vision, rhinorrhea, sore throat CV: Denies CP, palpitations, SOB, orthopnea Pulm: Denies SOB, cough, wheezing GI: Denies abdominal pain, nausea, vomiting, diarrhea, constipation GU: Denies dysuria, hematuria, frequency, vaginal discharge Msk: Denies muscle cramps,    +low back pain,  joint pains    Neuro: Denies weakness, numbness, tingling Skin: Denies rashes, bruising   +bump on L breast. Psych: Denies depression, anxiety, hallucinations     Objective:    Blood pressure 102/76, pulse 98, temperature 98.3 F (36.8 C), temperature source Oral, weight 181 lb (82.1 kg), last menstrual period 03/24/2016, SpO2 97 %.   Gen. Pleasant, well-nourished, in no distress, normal affect   HEENT: [place]/AT, face symmetric, no scleral icterus, PERRLA, nares patent without drainage Lungs: no accessory muscle use, CTAB, no wheezes or rales Cardiovascular: RRR, no m/r/g,  no peripheral edema MSK:  No TTP of cervical, thoracic, lumbar paraspinal muscles.  Normal range of motion of spine. Neuro:  A&Ox3, CN II-XII intact, normal gait Skin:  Warm, dry, intact.  Well healing mildly erythematous slightly raised lesion on L medial breast without increased warmth or fluctuance.   Wt Readings from Last 3 Encounters:  12/29/16 181 lb (82.1 kg)  10/23/16 172 lb 12.8 oz (78.4 kg)  05/17/16 167 lb 9.6 oz (76 kg)    Lab Results  Component Value Date   WBC 7.0 05/17/2016   HGB 13.5 05/17/2016   HCT 39.3 05/17/2016   PLT 300.0 05/17/2016   GLUCOSE 85 05/17/2016   CHOL 224 (H) 05/17/2016   TRIG 66.0 05/17/2016   HDL 67.70 05/17/2016   LDLCALC 143 (H) 05/17/2016   ALT 18 05/17/2016   AST 22 05/17/2016   NA 138 05/17/2016   K 4.1 05/17/2016   CL 103 05/17/2016   CREATININE 0.76 05/17/2016   BUN 19 05/17/2016   CO2 27 05/17/2016   TSH 0.91 05/17/2016   HGBA1C 5.4 05/17/2016    Assessment/Plan:  Arthritis  -pt given refill until able to see new provider. - Plan: meloxicam (MOBIC) 15 MG tablet  F/u on other issues prn.

## 2017-01-18 ENCOUNTER — Encounter: Payer: Self-pay | Admitting: Family Medicine

## 2017-01-18 ENCOUNTER — Ambulatory Visit: Payer: 59 | Admitting: Family Medicine

## 2017-01-18 VITALS — BP 112/70 | HR 65 | Temp 97.4°F | Ht 66.0 in | Wt 179.2 lb

## 2017-01-18 DIAGNOSIS — M1711 Unilateral primary osteoarthritis, right knee: Secondary | ICD-10-CM | POA: Diagnosis not present

## 2017-01-18 DIAGNOSIS — R635 Abnormal weight gain: Secondary | ICD-10-CM

## 2017-01-18 DIAGNOSIS — N3946 Mixed incontinence: Secondary | ICD-10-CM | POA: Diagnosis not present

## 2017-01-18 MED ORDER — DICLOFENAC SODIUM 2 % TD SOLN: 2.0000 g | Freq: Every day | TRANSDERMAL | 0 refills | Status: DC

## 2017-01-18 MED ORDER — DICLOFENAC SODIUM 75 MG PO TBEC
75.0000 mg | DELAYED_RELEASE_TABLET | Freq: Two times a day (BID) | ORAL | 0 refills | Status: DC
Start: 2017-01-18 — End: 2017-02-12

## 2017-01-18 MED ORDER — MIRABEGRON ER 25 MG PO TB24: 25.0000 mg | ORAL_TABLET | Freq: Every day | ORAL | 1 refills | Status: DC

## 2017-01-20 ENCOUNTER — Encounter: Payer: Self-pay | Admitting: Family Medicine

## 2017-01-20 DIAGNOSIS — M545 Low back pain, unspecified: Secondary | ICD-10-CM | POA: Insufficient documentation

## 2017-01-20 DIAGNOSIS — N3946 Mixed incontinence: Secondary | ICD-10-CM | POA: Insufficient documentation

## 2017-01-20 DIAGNOSIS — M542 Cervicalgia: Secondary | ICD-10-CM

## 2017-01-20 DIAGNOSIS — G8929 Other chronic pain: Secondary | ICD-10-CM | POA: Insufficient documentation

## 2017-01-20 DIAGNOSIS — N2889 Other specified disorders of kidney and ureter: Secondary | ICD-10-CM | POA: Insufficient documentation

## 2017-01-20 DIAGNOSIS — F4323 Adjustment disorder with mixed anxiety and depressed mood: Secondary | ICD-10-CM | POA: Insufficient documentation

## 2017-01-20 NOTE — Progress Notes (Signed)
Daine FlorasSharon W Sarr is a 57 y.o. [facility]   Patient Care Team: Helane RimaWallace, Adriaan Maltese, DO as PCP - General (Family Medicine)   History of Present Illness:   HPI: See Assessment and Plan section for Problem Based Charting of issues discussed today.  Health Maintenance Due  Topic Date Due  . HIV Screening  09/09/1976  . INFLUENZA VACCINE  08/16/2016   No flowsheet data found.   PMHx, SurgHx, SocialHx, Medications, and Allergies were reviewed in the Visit Navigator and updated as appropriate.   Past Medical History:  Diagnosis Date  . Arthritis   . Depression   . Frequent headaches   . 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 cystoscope Ivp Bilateral [id number]  . TUBAL LIGATION Bilateral 1997    Family History  Problem Relation Age of Onset  . Arthritis Mother   . Hypertension Father   . Diabetes Maternal Grandmother   . Birth defects Maternal Grandfather    Social History   Tobacco Use  . Smoking status: Former Smoker    Packs/day: 1.00    Years: 10.00    Pack years: 10.00    Last attempt to quit: 05/18/1998    Years since quitting: 18.6  . Smokeless tobacco: Former Engineer, waterUser  Substance Use Topics  . Alcohol use: Yes    Alcohol/week: 1.2 oz    Types: 2 Glasses of wine per week    Comment: occassional  . Drug use: No    Current Medications and Allergies:   .  Biotin 800 MCG TABS, Take 1 tablet by mouth daily., Disp: , Rfl:  .  clonazePAM (KLONOPIN) 0.5 MG tablet, TAKE 1 TABLET BY MOUTH TID, Disp: , Rfl: 2 .  escitalopram (LEXAPRO) 20 MG tablet, Take 20 mg by mouth daily., Disp: , Rfl:  .  meloxicam (MOBIC) 15 MG tablet, Take 1 tablet (15 mg total) by mouth daily., Disp: 30 tablet, Rfl: 0 .  Nutritional Supplements (ESTROVEN WEIGHT MANAGEMENT) CAPS, Take 1 capsule by mouth every other day., Disp: , Rfl:  .  QUNOL COQ10/UBIQUINOL/MEGA PO, Take 100 mLs by mouth daily. , Disp: , Rfl:   No Known Allergies    Review of Systems:   Pertinent items are noted in the HPI. Otherwise, ROS is negative.  Vitals:   Vitals:   01/18/17 1105  BP: 112/70  Pulse: 65  Temp: (!) 97.4 F (36.3 C)  TempSrc: Oral  SpO2: 97%  Weight: 179 lb 3.2 oz (81.3 kg)  Height: 5\' 6"  (1.676 m)     Body mass index is 28.92 kg/m.  Physical Exam:   Physical Exam  Constitutional: She is oriented to person, place, and time. She appears well-developed and well-nourished. No distress.  HENT:  Head: Normocephalic and atraumatic.  Right Ear: External ear normal.  Left Ear: External ear normal.  Nose: Nose normal.  Mouth/Throat: Oropharynx is clear and moist.  Eyes: Conjunctivae and EOM are normal. Pupils are equal, round, and reactive to light.  Neck: Normal range of motion. Neck supple. No thyromegaly present.  Cardiovascular: Normal rate, regular rhythm, normal heart sounds and intact distal pulses.  Pulmonary/Chest: Effort normal and breath sounds normal.  Abdominal: Soft. Bowel sounds are normal.  Musculoskeletal: Normal range of motion.       Right knee: She exhibits no effusion. Tenderness found. Medial joint line tenderness noted.  Lymphadenopathy:    She has no cervical adenopathy.  Neurological: She  is alert and oriented to person, place, and time.  Skin: Skin is warm and dry. Capillary refill takes less than 2 seconds.  Psychiatric: She has a normal mood and affect. Her behavior is normal.  Nursing note and vitals reviewed.  Assessment and Plan:   1. Primary osteoarthritis of right knee Patient presents with knee pain involving the right knee. Onset of the symptoms was several months ago. Inciting event: this is a longstanding problem which has been getting worse. Current symptoms include crepitus sensation, giving out and stiffness. Pain is aggravated by any weight bearing. Evaluation to date: plain films: abnormal bony spurring from the patella femoral articulation. Treatment to date: prescription  NSAIDS which are somewhat effective.  - Ambulatory referral to Sports Medicine - Diclofenac Sodium (PENNSAID) 2 % SOLN; Place 2 g onto the skin daily.  Dispense: 2 g; Refill: 0 - diclofenac (VOLTAREN) 75 MG EC tablet; Take 1 tablet (75 mg total) by mouth 2 (two) times daily.  Dispense: 60 tablet; Refill: 0  2. Weight gain The patient is asked to make an attempt to improve diet and exercise patterns to aid in medical management of this problem.   3. Mixed stress and urge urinary incontinence Patient complains of urinary incontinence. This has been present for several years. She leaks urine with coughing, laughing, sneezing, with urge. Patient describes the symptoms as urine leaking unpredictably. Factors associated with symptoms include G5P6 status. Evaluation to date includes none. Treatment to date includes Kegel exercises, which was not very effective.  - mirabegron ER (MYRBETRIQ) 25 MG TB24 tablet; Take 1 tablet (25 mg total) by mouth daily.  Dispense: 30 tablet; Refill: 1 - Ambulatory referral to Physical Therapy   . Reviewed expectations re: course of current medical issues. . Discussed self-management of symptoms. . Outlined signs and symptoms indicating need for more acute intervention. . Patient verbalized understanding and all questions were answered. Marland Kitchen Health Maintenance issues including appropriate healthy diet, exercise, and smoking avoidance were discussed with patient. . See orders for this visit as documented in the electronic medical record. . Patient received an After Visit Summary.  Helane Rima, DO [place], [place] [facility] 01/20/2017

## 2017-01-22 ENCOUNTER — Ambulatory Visit: Payer: 59 | Admitting: Sports Medicine

## 2017-01-24 DIAGNOSIS — M545 Low back pain: Secondary | ICD-10-CM | POA: Diagnosis not present

## 2017-01-26 ENCOUNTER — Telehealth: Payer: Self-pay | Admitting: Family Medicine

## 2017-01-26 NOTE — Telephone Encounter (Signed)
Pre auth sent in  Key: [id number] Case ID: [id number] Pending response in 48 business hours

## 2017-01-26 NOTE — Telephone Encounter (Signed)
Copied from CRM [phone]. Topic: General - Other >> Jan 26, 2017  9:10 AM Gerrianne ScalePayne, Larena Ohnemus L wrote: Reason for CRM: patient calling stating that the pharmacy [facility] [id number] - [place] has been trying to get in touch about a prior authorization on mirabegron ER (MYRBETRIQ) 25 MG TB24 tablet  Stating that her insurance want pay for it

## 2017-01-29 ENCOUNTER — Encounter: Payer: Self-pay | Admitting: Sports Medicine

## 2017-01-29 ENCOUNTER — Ambulatory Visit: Payer: 59 | Admitting: Sports Medicine

## 2017-01-29 VITALS — BP 108/72 | HR 61 | Ht 66.5 in | Wt 176.8 lb

## 2017-01-29 DIAGNOSIS — G8929 Other chronic pain: Secondary | ICD-10-CM | POA: Diagnosis not present

## 2017-01-29 DIAGNOSIS — M25561 Pain in right knee: Secondary | ICD-10-CM | POA: Diagnosis not present

## 2017-01-29 DIAGNOSIS — M25569 Pain in unspecified knee: Secondary | ICD-10-CM | POA: Insufficient documentation

## 2017-01-29 NOTE — Patient Instructions (Addendum)
Here are some basic exercise recommendations to remember:  Try to be active every day and throughout the day.    We have actually found that being active throughout the day is likely more important than getting to the gym 5 days per week.  Minimizing being in active should be an important health goal we all are working towards.  A basic starting point can be limiting the time that you sit or lay still during the day.  You should sit/lay/lounge for no longer than 20-30 minutes at a time if you are able.  Even interrupting sitting with standing/jumping jacks/dancing/etc for one minute can have significant health benefits.   Try to remember: "Why sit when you can stand, why stand when you can walk, why walk when you can run."  The point he is to look for opportunities during the day where you can increase your heart rate.    Ideally, I recommend you exercise for at least 30 minutes per day, 5 days per week.  This would involve any activity that will elevate your heart rate to the point that you have a hard time carrying on a normal conversation, but not to the point of being completely out of breath.  There are alternative options however this is a generally good goal to strive for.  You can adjust your intensity based on heart rate (HR).  To calculate your target HR take 220 minus your age, then multiply X 0.7 (70%).  Example for 57 year old:  220 - 40 = 180;  180 X 0.7 = 126  Try to keep your HR within 10 beats of this target throughout your exercise   I am always happy to talk more about "Exercise as medicine" if you are interested"    Please perform the exercise program that we have prepared for you and gone over in detail on a daily basis.  In addition to the handout you were provided you can access your program through: [email or web address]   Your unique program code is: [id number]   Also check out "[employer]" which is a program developed by Dr. Myles LippsEric Goodman.   There are  links to a couple of his YouTube Videos below and I would like to see performing one of his videos 5-6 days per week.    A good intro video is: "Independence from Pain 7-minute Video" - [email or web address]   His more advanced video is: "Powerful Posture and Pain Relief: 12 minutes of Foundation Training" - https://youtu.be/[email or web address]  Do not try to attempt this entire video when first beginning.    Try breaking of each exercise that he goes into shorter segments.  Otherwise if they perform an exercise for 45 seconds, start with 15 seconds and rest and then resume when they begin the new activity.    If you work your way up to doing this 12 minute video, I expect you will see significant improvements in your pain.  If you enjoy his videos and would like to find out more you can look on his website: [email or web address].  He has a workout streaming option as well as a DVD set available for purchase.  Amazon has the best price for his DVDs.

## 2017-01-29 NOTE — Procedures (Signed)
PROCEDURE NOTE: THERAPEUTIC EXERCISES (97110) 15 minutes spent for Therapeutic exercises as below and as referenced in the AVS. This included exercises focusing on stretching, strengthening, with significant focus on eccentric aspects.  Proper technique shown and discussed handout in great detail with ATC. All questions were discussed and answered.   Long term goals include an improvement in range of motion, strength, endurance as well as avoiding reinjury. Frequency of visits is one time as determined during today's  office visit. Frequency of exercises to be performed is as per handout.  EXERCISES REVIEWED:  Hip abduction strengthening  IT band stretching  Goodman exercises  

## 2017-01-29 NOTE — Progress Notes (Signed)
Sandra Mccarthy. [facility] [phone]  Sandra Mccarthy - 57 y.o. female MRN [id number]  Date of birth: September 18, 1960  Visit Date: 01/29/2017  PCP: Helane Rima, DO   Referred by: Helane Rima, DO   Scribe for today's visit: Stevenson Clinch, CMA     SUBJECTIVE:  Sandra Mccarthy is here for New Patient (Initial Visit) (RT knee pain) .  Referred by: Dr. Helane Rima Her RT knee pain symptoms INITIALLY: Began several years ago bu thas flared up over the past several months. She has recently started working out, doing boot camp x 2 weeks.  Described as moderate sharp pinching pain while working out. Pain does radiate into the lateral aspect of the RT thigh after working out. She feels stiffness around the knee and feels that it may give out on her at times.  Worsened with lunges, squats, jogging, anything that puts pressure on the knee/weightbearing.  Improved with rest.  Additional associated symptoms include: She denies swelling around the knee but has noticed clicking and popping.  She has also noticed cramping in her legs at night. She has also noticed that at times her 3rd and 4th toes on RT foot go numb.     At this time symptoms are improving compared to onset, when she is in the middle of activities she can feel pain with certain movements.  She was provided with a sample of Pennsaid which she hasn't really been using. She has been taking Naproxen with some relief. She does wear a compression during activities with some relief.   She had xray several years ago which showed bone spurs.    ROS Reports night time disturbances, aching in both legs. Denies fevers, chills, or night sweats. Denies unexplained weight loss. Denies personal history of cancer. Denies changes in bowel or bladder habits. Denies recent unreported falls. Reports new or worsening dyspnea or wheezing. Reports headaches or dizziness.  Reports numbness,  tingling or weakness  In the extremities.  Denies dizziness or presyncopal episodes Denies lower extremity edema     HISTORY & PERTINENT PRIOR DATA:  Prior History reviewed and updated per electronic medical record.  Significant history, findings, studies and interim changes include:  reports that she quit smoking about 18 years ago. She has a 10.00 pack-year smoking history. She has quit using smokeless tobacco. Recent Labs    05/17/16 1139  HGBA1C 5.4   No specialty comments available. Problem  Knee Pain   04/03/13 -2 views right knee, minimal spurring of the patellofemoral joint.  This is minimal.   Arthritis    OBJECTIVE:  VS:  HT:5' 6.5" (168.9 cm)   WT:176 lb 12.8 oz (80.2 kg)  BMI:28.11    BP:108/72  HR:61bpm  TEMP: ( )  RESP:97 %   PHYSICAL EXAM: Constitutional: WDWN, Non-toxic appearing. Psychiatric: Alert & appropriately interactive. Not depressed or anxious appearing. Respiratory: No increased work of breathing. Trachea Midline Eyes: Pupils are equal. EOM intact without nystagmus. No scleral icterus Cardiovascular:  Peripheral Pulses: peripheral pulses symmetrical No clubbing or cyanosis appreciated Capillary Refill is normal, less than 2 seconds No signficant generalized edema/anasarca Sensory Exam: intact to light touch  Bilateral straight leg raise is negative. Right knee is overall well aligned.  She has no effusion.  Extensor mechanism is intact and without limitations in range of motion.  Ligamentously she is stable.  No pain with McMurray's.  Negative Thessaly.  She has a small amount  of pain with Stinchfield testing but this is minimal.  Her lower extremity sensation is intact light touch and strength is 5 out of 5 in all myotomes.  She does have some weakness with hip abduction while side-lying testing the right glute medius and does have a T FL predominant recruitment pattern at this time.  She has tight hip flexors as well.  No additional findings.     ASSESSMENT & PLAN:   1. Chronic pain of right knee    PLAN: >50% of this 30 minute visit spent in direct patient counseling and/or coordination of care.  Discussion was focused on education regarding the in discussing the pathoetiology and anticipated clinical course of the above condition.  We spent majority of time discussing counseling coordination of care regarding returning exercises and increasing her physical activity without exacerbating her underlying knee issues.  She is also begun to develop some low back pain and likely has some underlying degenerative changes in her lumbar spine but the therapeutic exercises that we discussed should be beneficial for this as well.  If any lack of improvement further diagnostic evaluation with repeat x-rays of her knee and/or lumbar spine can be considered and will plan to check in with her only on an as-needed basis.   ++++++++++++++++++++++++++++++++++++++++++++ Orders & Meds: Orders Placed This Encounter  Procedures  . Misc procedure    No orders of the defined types were placed in this encounter.   ++++++++++++++++++++++++++++++++++++++++++++ Follow-up: Return if symptoms worsen or fail to improve.   Pertinent documentation may be included in additional procedure notes, imaging studies, problem based documentation and patient instructions. Please see these sections of the encounter for additional information regarding this visit. CMA/ATC served as Neurosurgeonscribe during this visit. History, Physical, and Plan performed by medical provider. Documentation and orders reviewed and attested to.      Andrena MewsMichael D Rigby, DO    [facility]

## 2017-02-01 ENCOUNTER — Other Ambulatory Visit: Payer: Self-pay | Admitting: Family Medicine

## 2017-02-01 DIAGNOSIS — M1990 Unspecified osteoarthritis, unspecified site: Secondary | ICD-10-CM

## 2017-02-06 ENCOUNTER — Encounter (HOSPITAL_COMMUNITY): Payer: Self-pay | Admitting: Emergency Medicine

## 2017-02-06 ENCOUNTER — Emergency Department (HOSPITAL_COMMUNITY): Payer: 59

## 2017-02-06 ENCOUNTER — Emergency Department (HOSPITAL_COMMUNITY)
Admission: EM | Admit: 2017-02-06 | Discharge: 2017-02-06 | Disposition: A | Discharge status: Home / Self Care | Payer: 59 | Attending: Emergency Medicine | Admitting: Emergency Medicine

## 2017-02-06 DIAGNOSIS — S30811A Abrasion of abdominal wall, initial encounter: Secondary | ICD-10-CM | POA: Diagnosis not present

## 2017-02-06 DIAGNOSIS — S299XXA Unspecified injury of thorax, initial encounter: Secondary | ICD-10-CM | POA: Diagnosis not present

## 2017-02-06 DIAGNOSIS — Y9389 Activity, other specified: Secondary | ICD-10-CM | POA: Diagnosis not present

## 2017-02-06 DIAGNOSIS — Z79899 Other long term (current) drug therapy: Secondary | ICD-10-CM | POA: Diagnosis not present

## 2017-02-06 DIAGNOSIS — R1032 Left lower quadrant pain: Secondary | ICD-10-CM | POA: Insufficient documentation

## 2017-02-06 DIAGNOSIS — S8992XA Unspecified injury of left lower leg, initial encounter: Secondary | ICD-10-CM | POA: Diagnosis not present

## 2017-02-06 DIAGNOSIS — Z87891 Personal history of nicotine dependence: Secondary | ICD-10-CM | POA: Diagnosis not present

## 2017-02-06 DIAGNOSIS — S3991XA Unspecified injury of abdomen, initial encounter: Secondary | ICD-10-CM | POA: Diagnosis not present

## 2017-02-06 DIAGNOSIS — S80212A Abrasion, left knee, initial encounter: Secondary | ICD-10-CM | POA: Insufficient documentation

## 2017-02-06 DIAGNOSIS — Y92410 Unspecified street and highway as the place of occurrence of the external cause: Secondary | ICD-10-CM | POA: Diagnosis not present

## 2017-02-06 DIAGNOSIS — Y998 Other external cause status: Secondary | ICD-10-CM | POA: Diagnosis not present

## 2017-02-06 DIAGNOSIS — R0789 Other chest pain: Secondary | ICD-10-CM | POA: Diagnosis not present

## 2017-02-06 DIAGNOSIS — M25562 Pain in left knee: Secondary | ICD-10-CM | POA: Diagnosis not present

## 2017-02-06 DIAGNOSIS — R079 Chest pain, unspecified: Secondary | ICD-10-CM | POA: Diagnosis not present

## 2017-02-06 DIAGNOSIS — R109 Unspecified abdominal pain: Secondary | ICD-10-CM | POA: Diagnosis not present

## 2017-02-06 LAB — I-STAT CHEM 8, ED
BUN: 23 mg/dL — ABNORMAL HIGH (ref 6–20)
CALCIUM ION: 1.22 mmol/L (ref 1.15–1.40)
Chloride: 105 mmol/L (ref 101–111)
Creatinine, Ser: 0.7 mg/dL (ref 0.44–1.00)
Glucose, Bld: 93 mg/dL (ref 65–99)
HCT: 38 % (ref 36.0–46.0)
Hemoglobin: 12.9 g/dL (ref 12.0–15.0)
Potassium: 3.7 mmol/L (ref 3.5–5.1)
SODIUM: 140 mmol/L (ref 135–145)
TCO2: 24 mmol/L (ref 22–32)

## 2017-02-06 LAB — I-STAT BETA HCG BLOOD, ED (MC, WL, AP ONLY): I-stat hCG, quantitative: <5 m[IU]/mL (ref <5)

## 2017-02-06 MED ORDER — NAPROXEN 500 MG PO TABS: 500.0000 mg | ORAL_TABLET | Freq: Two times a day (BID) | ORAL | 0 refills | Status: DC

## 2017-02-06 MED ORDER — METHOCARBAMOL 500 MG PO TABS: 500.0000 mg | ORAL_TABLET | Freq: Three times a day (TID) | ORAL | 0 refills | Status: DC | PRN

## 2017-02-06 MED ORDER — IOPAMIDOL (ISOVUE-300) INJECTION 61%
INTRAVENOUS | Status: AC
  Administered 2017-02-06: 100 mL
  Filled 2017-02-06: qty 100

## 2017-02-06 MED ORDER — KETOROLAC TROMETHAMINE 15 MG/ML IJ SOLN
15.0000 mg | Freq: Once | INTRAMUSCULAR | Status: AC
  Administered 2017-02-06: 15 mg via INTRAVENOUS
  Filled 2017-02-06: qty 1

## 2017-02-06 NOTE — Telephone Encounter (Signed)
Patient No Showed for Pre-Visit. A message was left on her voice mail to call and reschedule PV before 5:00 Pm today. If patient does not call and reschedule before 5:00 the colonoscopy that is scheduled for 02/19/17 will be cancelled per [facility] guidelines. A No Show letter will be mailed at the end of the business day.   Janalee DaneNancy Aadhav Uhlig, LPN  ( PV )
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
## 2017-02-07 DIAGNOSIS — M545 Low back pain: Secondary | ICD-10-CM | POA: Diagnosis not present

## 2017-02-12 ENCOUNTER — Other Ambulatory Visit: Payer: Self-pay | Admitting: Family Medicine

## 2017-02-12 DIAGNOSIS — M1711 Unilateral primary osteoarthritis, right knee: Secondary | ICD-10-CM

## 2017-02-12 NOTE — Telephone Encounter (Signed)
Please advise on refill.

## 2017-02-13 ENCOUNTER — Ambulatory Visit (AMBULATORY_SURGERY_CENTER): Payer: 59

## 2017-02-13 ENCOUNTER — Other Ambulatory Visit: Payer: Self-pay

## 2017-02-13 ENCOUNTER — Encounter: Payer: Self-pay | Admitting: Gastroenterology

## 2017-02-13 VITALS — Ht 67.0 in | Wt 177.4 lb

## 2017-02-13 DIAGNOSIS — Z1211 Encounter for screening for malignant neoplasm of colon: Secondary | ICD-10-CM

## 2017-02-13 MED ORDER — NA SULFATE-K SULFATE-MG SULF 17.5-3.13-1.6 GM/177ML PO SOLN: 1.0000 | Freq: Once | ORAL | 0 refills | Status: AC

## 2017-02-13 NOTE — Progress Notes (Signed)
Denies allergies to eggs or soy products. Denies complication of anesthesia or sedation. Denies use of weight loss medication. Denies use of O2.   Emmi instructions declined.  

## 2017-02-19 ENCOUNTER — Ambulatory Visit (AMBULATORY_SURGERY_CENTER): Payer: 59 | Admitting: Gastroenterology

## 2017-02-19 ENCOUNTER — Encounter: Payer: Self-pay | Admitting: Gastroenterology

## 2017-02-19 VITALS — BP 105/70 | HR 65 | Temp 98.0°F | Resp 9 | Ht 67.0 in | Wt 177.0 lb

## 2017-02-19 DIAGNOSIS — Z1212 Encounter for screening for malignant neoplasm of rectum: Secondary | ICD-10-CM

## 2017-02-19 DIAGNOSIS — Z1211 Encounter for screening for malignant neoplasm of colon: Secondary | ICD-10-CM

## 2017-02-19 MED ORDER — SODIUM CHLORIDE 0.9 % IV SOLN: 500.0000 mL | Freq: Once | INTRAVENOUS | Status: DC

## 2017-02-19 NOTE — Progress Notes (Signed)
Pt's states no medical or surgical changes since previsit or office visit. 

## 2017-02-19 NOTE — Op Note (Signed)
[facility] Patient Name: Sandra Mccarthy Procedure Date: 02/19/2017 9:08 AM MRN: [id number] Endoscopist: Napoleon Form , MD Age: 57 Referring MD:  Date of Birth: 1960/06/02 Gender: Female Account #: [id number] Procedure:                Colonoscopy Indications:              Screening for colorectal malignant neoplasm Medicines:                Monitored Anesthesia Care Procedure:                Pre-Anesthesia Assessment:                           - Prior to the procedure, a History and Physical                            was performed, and patient medications and                            allergies were reviewed. The patient's tolerance of                            previous anesthesia was also reviewed. The risks                            and benefits of the procedure and the sedation                            options and risks were discussed with the patient.                            All questions were answered, and informed consent                            was obtained. Prior Anticoagulants: The patient has                            taken no previous anticoagulant or antiplatelet                            agents. ASA Grade Assessment: I - A normal, healthy                            patient. After reviewing the risks and benefits,                            the patient was deemed in satisfactory condition to                            undergo the procedure.                           After obtaining informed consent, the colonoscope  was passed under direct vision. Throughout the                            procedure, the patient's blood pressure, pulse, and                            oxygen saturations were monitored continuously. The                            Model PCF-H190DL [id number]) scope was introduced                            through the anus and advanced to the the cecum,                            identified by appendiceal  orifice and ileocecal                            valve. The colonoscopy was performed without                            difficulty. The patient tolerated the procedure                            well. The quality of the bowel preparation was                            excellent. The ileocecal valve, appendiceal                            orifice, and rectum were photographed. Scope In: 9:17:37 AM Scope Out: 9:30:23 AM Scope Withdrawal Time: 0 hours 8 minutes 19 seconds  Total Procedure Duration: 0 hours 12 minutes 46 seconds  Findings:                 The perianal and digital rectal examinations were                            normal.                           Non-bleeding internal hemorrhoids were found during                            retroflexion. The hemorrhoids were small.                           The exam was otherwise without abnormality. Complications:            No immediate complications. Estimated Blood Loss:     Estimated blood loss: none. Impression:               - Non-bleeding internal hemorrhoids.                           - The examination was otherwise normal.                           -  No specimens collected. Recommendation:           - Patient has a contact number available for                            emergencies. The signs and symptoms of potential                            delayed complications were discussed with the                            patient. Return to normal activities tomorrow.                            Written discharge instructions were provided to the                            patient.                           - Resume previous diet.                           - Continue present medications.                           - Repeat colonoscopy in 10 years for screening                            purposes. Napoleon FormKavitha V. Gerarda Conklin, MD 02/19/2017 9:39:31 AM This report has been signed electronically.

## 2017-02-19 NOTE — Patient Instructions (Signed)
Handouts given for hemorrhoids  YOU HAD AN ENDOSCOPIC PROCEDURE TODAY AT THE [facility]:   Refer to the procedure report that was given to you for any specific questions about what was found during the examination.  If the procedure report does not answer your questions, please call your gastroenterologist to clarify.  If you requested that your care partner not be given the details of your procedure findings, then the procedure report has been included in a sealed envelope for you to review at your convenience later.  YOU SHOULD EXPECT: Some feelings of bloating in the abdomen. Passage of more gas than usual.  Walking can help get rid of the air that was put into your GI tract during the procedure and reduce the bloating. If you had a lower endoscopy (such as a colonoscopy or flexible sigmoidoscopy) you may notice spotting of blood in your stool or on the toilet paper. If you underwent a bowel prep for your procedure, you may not have a normal bowel movement for a few days.  Please Note:  You might notice some irritation and congestion in your nose or some drainage.  This is from the oxygen used during your procedure.  There is no need for concern and it should clear up in a day or so.  SYMPTOMS TO REPORT IMMEDIATELY:   Following lower endoscopy (colonoscopy or flexible sigmoidoscopy):  Excessive amounts of blood in the stool  Significant tenderness or worsening of abdominal pains  Swelling of the abdomen that is new, acute  Fever of 100F or higher   For urgent or emergent issues, a gastroenterologist can be reached at any hour by calling [phone].   DIET:  We do recommend a small meal at first, but then you may proceed to your regular diet.  Drink plenty of fluids but you should avoid alcoholic beverages for 24 hours.  ACTIVITY:  You should plan to take it easy for the rest of today and you should NOT DRIVE or use heavy machinery until tomorrow (because of the sedation  medicines used during the test).    FOLLOW UP: Our staff will call the number listed on your records the next business day following your procedure to check on you and address any questions or concerns that you may have regarding the information given to you following your procedure. If we do not reach you, we will leave a message.  However, if you are feeling well and you are not experiencing any problems, there is no need to return our call.  We will assume that you have returned to your regular daily activities without incident.  If any biopsies were taken you will be contacted by phone or by letter within the next 1-3 weeks.  Please call us at [phone] if you have not heard about the biopsies in 3 weeks.    SIGNATURES/CONFIDENTIALITY: You and/or your care partner have signed paperwork which will be entered into your electronic medical record.  These signatures attest to the fact that that the information above on your After Visit Summary has been reviewed and is understood.  Full responsibility of the confidentiality of this discharge information lies with you and/or your care-partner. 

## 2017-02-19 NOTE — Progress Notes (Signed)
Report given to PACU, vss 

## 2017-02-20 ENCOUNTER — Telehealth: Payer: Self-pay

## 2017-02-20 NOTE — Telephone Encounter (Signed)
  Follow up Call-  Call back number 02/19/2017  Post procedure Call Back phone  # [phone]  Permission to leave phone message Yes  Some recent data might be hidden     Patient questions:  Do you have a fever, pain , or abdominal swelling? No. Pain Score  0 *  Have you tolerated food without any problems? Yes.    Have you been able to return to your normal activities? Yes.    Do you have any questions about your discharge instructions: Diet   No. Medications  No. Follow up visit  No.  Do you have questions or concerns about your Care? No.  Actions: * If pain score is 4 or above: No action needed, pain <4.

## 2017-02-21 DIAGNOSIS — M545 Low back pain: Secondary | ICD-10-CM | POA: Diagnosis not present

## 2017-06-15 ENCOUNTER — Telehealth: Payer: Self-pay | Admitting: Family Medicine

## 2017-06-15 ENCOUNTER — Encounter: Payer: Self-pay | Admitting: Sports Medicine

## 2017-06-15 ENCOUNTER — Ambulatory Visit: Payer: Self-pay

## 2017-06-15 ENCOUNTER — Ambulatory Visit: Payer: 59 | Admitting: Physician Assistant

## 2017-06-15 ENCOUNTER — Other Ambulatory Visit: Payer: Self-pay

## 2017-06-15 ENCOUNTER — Ambulatory Visit: Payer: 59 | Admitting: Sports Medicine

## 2017-06-15 VITALS — BP 110/80 | HR 69 | Ht 67.0 in | Wt 178.8 lb

## 2017-06-15 DIAGNOSIS — M79605 Pain in left leg: Secondary | ICD-10-CM | POA: Diagnosis not present

## 2017-06-15 DIAGNOSIS — S83282A Other tear of lateral meniscus, current injury, left knee, initial encounter: Secondary | ICD-10-CM | POA: Diagnosis not present

## 2017-06-15 DIAGNOSIS — M1990 Unspecified osteoarthritis, unspecified site: Secondary | ICD-10-CM

## 2017-06-15 MED ORDER — MELOXICAM 15 MG PO TABS: 15.0000 mg | ORAL_TABLET | Freq: Every day | ORAL | 1 refills | Status: DC

## 2017-06-15 NOTE — Telephone Encounter (Signed)
PA for Myrbetriq denied

## 2017-06-15 NOTE — Patient Instructions (Signed)

## 2017-06-15 NOTE — Progress Notes (Signed)
Veverly FellsMichael D. Delorise Shinerigby, DO  [facility] [phone]  Sandra FlorasSharon W Mccarthy - 57 y.o. female MRN [id number]  Date of birth: 05-05-1960  Visit Date: 06/15/2017  PCP: Helane RimaWallace, Erica, DO   Referred by: Helane RimaWallace, Erica, DO  Scribe for today's visit: Stevenson ClinchBrandy Coleman, CMA     SUBJECTIVE:  Sandra Mccarthy is here for leg pain s/p fall  Her L leg pain symptoms INITIALLY: Began May 18th 2019 after a fall while in [place]. She [place] while it was raining. She thinks that she hit the lateral aspect of her knee.  Described as moderate sharp pain, radiating to the L thigh, calf, and ankle.  Worsened with stairs and bending the knee (initially).  Improved with rest Additional associated symptoms include: She has tenderness to palpation on the posterior aspect of the knee. She also c/o swelling which resolved with ice but then comes back.     At this time symptoms are worsening compared to onset. Sx began to improve but then when she woke up this morning the pain was back to where it was initially.  She has been icing and elevating the leg with some relief. She has been taking Naproxen with some relief.   ROS Reports night time disturbances. Denies fevers, chills, or night sweats. Denies unexplained weight loss. Denies personal history of cancer. Denies changes in bowel or bladder habits. Reports recent unreported falls. Denies new or worsening dyspnea or wheezing. Reports headaches or dizziness.  Reports weakness in the LLE.  Denies dizziness or presyncopal episodes Reports lower extremity edema    HISTORY & PERTINENT PRIOR DATA:  Prior History reviewed and updated per electronic medical record.  Significant/pertinent history, findings, studies include:  reports that she quit smoking about 19 years ago. She has a 10.00 pack-year smoking history. She has quit using smokeless tobacco. No results for input(s): HGBA1C, LABURIC,  CREATINE in the last 8760 hours. No specialty comments available. No problems updated.  OBJECTIVE:  VS:  HT:5\' 7"  (170.2 cm)   WT:178 lb 12.8 oz (81.1 kg)  BMI:28    BP:110/80  HR:69bpm  TEMP: ( )  RESP:97 %   PHYSICAL EXAM: Constitutional: WDWN, Non-toxic appearing. Psychiatric: Alert & appropriately interactive.  Not depressed or anxious appearing. Respiratory: No increased work of breathing.  Trachea Midline Eyes: Pupils are equal.  EOM intact without nystagmus.  No scleral icterus  Vascular Exam: warm to touch no edema  lower extremity neuro exam: unremarkable  MSK Exam: Left leg is overall well aligned.  She has tenderness over the lateral joint line.  The pain is pain.  Pain with McMurray's test and rotation.  No significant effusion.    ASSESSMENT & PLAN:   1. Left leg pain   2. Acute lateral meniscal tear, left, initial encounter     PLAN: Ultrasound-guided injection performed today.  Avoid exacerbating activities and follow-up in 6 weeks.  Follow-up: Return in about 6 weeks (around 07/27/2017).      Please see additional documentation for Objective, Assessment and Plan sections. Pertinent additional documentation may be included in corresponding procedure notes, imaging studies, problem based documentation and patient instructions. Please see these sections of the encounter for additional information regarding this visit.  CMA/ATC served as Neurosurgeonscribe during this visit. History, Physical, and Plan performed by medical provider. Documentation and orders reviewed and attested to.      Andrena MewsMichael D Aloysious Vangieson, DO    [facility]  Physician

## 2017-06-15 NOTE — Telephone Encounter (Signed)
Rx sent in for Meloxicam ( I will contact pt and advise).   Also, Myrbetriq prescribed by Dr. Earlene Plater, I initiated PA today in office and it is pending review.

## 2017-06-15 NOTE — Telephone Encounter (Signed)
Copied from CRM [id number]. Topic: Inquiry >> Jun 15, 2017  2:05 PM Yvonna Alanis wrote: Reason for CRM: Patient called stating that she did not receive her prescription of Mobic (Meloxicam) after seeing Dr. Berline Chough today. Patient states it is not at her pharmacy. Patient also needs a refill of Mirabegron ER (MYRBETRIQ) 25 MG TB24 tablet. Patient's preferred pharmacy is [facility] [id number] - [place], [place] - [address] AT [facility] [address] [facility] [phone] (Phone)  [phone] (Fax). Please call the patient today.       Thank You!!!

## 2017-06-15 NOTE — Procedures (Signed)
PROCEDURE NOTE:  Ultrasound Guided: Injection: Left knee Images were obtained and interpreted by myself, Gaspar BiddingMichael Rigby, DO  Images have been saved and stored to PACS system. Images obtained on: GE S7 Ultrasound machine    ULTRASOUND FINDINGS:  Small effusion, generalized synovitis.  Lateral meniscal bulge with interstitial tear.  DESCRIPTION OF PROCEDURE:  The patient's clinical condition is marked by substantial pain and/or significant functional disability. Other conservative therapy has not provided relief, is contraindicated, or not appropriate. There is a reasonable likelihood that injection will significantly improve the patient's pain and/or functional impairment.   After discussing the risks, benefits and expected outcomes of the injection and all questions were reviewed and answered, the patient wished to undergo the above named procedure.  Verbal consent was obtained.  The ultrasound was used to identify the target structure and adjacent neurovascular structures. The skin was then prepped in sterile fashion and the target structure was injected under direct visualization using sterile technique as below:  PREP: Alcohol and Ethel Chloride APPROACH: superiolateral, single injection, 25g 1.5 in. INJECTATE: 1 cc 0.5% Marcaine and 1 cc 40mg /mL DepoMedrol ASPIRATE: None DRESSING: Band-Aid  Post procedural instructions including recommending icing and warning signs for infection were reviewed.    This procedure was well tolerated and there were no complications.   IMPRESSION: Succesful Ultrasound Guided: Injection

## 2017-06-15 NOTE — Telephone Encounter (Signed)
Called pt, VM full

## 2017-06-18 ENCOUNTER — Telehealth: Payer: Self-pay

## 2017-06-18 NOTE — Telephone Encounter (Signed)
Inform patient. Offer referral to Urology if she'd like it.

## 2017-06-18 NOTE — Telephone Encounter (Signed)
Pt called to make sure it was documented that she would like a phone call tomorrow if at all possible

## 2017-06-18 NOTE — Telephone Encounter (Signed)
Called patient to let her know about medication. She states she had injection with Dr. Berline Choughigby on Friday and is having increased pain in that knee. Was told that she did not stay out of work but when she went to work today her pain increased from being on her feet. Would like call back.

## 2017-06-18 NOTE — Telephone Encounter (Signed)
Left voicemail for patient to return call.

## 2017-06-18 NOTE — Telephone Encounter (Signed)
Forwarding to Dr. Rigby to advise.  

## 2017-06-19 ENCOUNTER — Other Ambulatory Visit: Payer: Self-pay

## 2017-06-19 DIAGNOSIS — N2889 Other specified disorders of kidney and ureter: Secondary | ICD-10-CM

## 2017-06-19 NOTE — Telephone Encounter (Signed)
Patient called back all information reviewed. I have placed referral to urology ok per patient. She was having some increased leg pain [facility].

## 2017-06-19 NOTE — Telephone Encounter (Signed)
Called pt and left VM to call the office.  

## 2017-06-21 MED ORDER — TRAMADOL HCL 50 MG PO TABS: 50.0000 mg | ORAL_TABLET | Freq: Three times a day (TID) | ORAL | 0 refills | Status: DC | PRN

## 2017-06-21 NOTE — Telephone Encounter (Signed)
Spoke with patient and she c/o continued knee pain. Pain is not worse than it was initially but it not getting better. She has pain that radiates down her leg to her ankle, this comes and goes. She c/o tightness in her calf. She has swelling on the posterior aspect of the knee which is worse at night and after being on her feet for extended periods of time. She has been icing and elevated her legs which does help with swelling. She denies fever, increased warmth or erythema, or drainage at the injection site. She take Meloxicam daily for arthritis but is requesting rx for Naproxen. I advised that it is not a good idea to take Meloxicam and Naproxen but Dr. Berline Choughigby is willing to send in rx for Tramadol. Pt is agreeable. Dr. Berline Choughigby also recommends using compression/ACE wrap if having continued trouble with swelling. Pt advised to f/u next Friday if sx do not improve or sooner if sx worsen. Pt verbalized understanding. Forwarding to Dr. Berline Choughigby to send in Rx for Tramadol.

## 2017-07-17 NOTE — Progress Notes (Signed)
EVANGALINE JOU is a 57 y.o. female is here for follow up.  History of Present Illness:   Barnie Mort, CMA acting as scribe for Dr. Helane Rima.   HPI: See Assessment and Plan section for Problem Based Charting of issues discussed today.   Health Maintenance Due  Topic Date Due  . HIV Screening  09/09/1976  . PAP SMEAR  04/05/2017   No flowsheet data found. PMHx, SurgHx, SocialHx, FamHx, Medications, and Allergies were reviewed in the Visit Navigator and updated as appropriate.   Patient Active Problem List   Diagnosis Date Noted  . Knee pain 01/29/2017  . 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 Adjustment reaction with anxiety and depression 01/20/2017  . Chronic cervical pain 01/20/2017  . Chronic lumbar pain 01/20/2017  . Arthritis 04/24/2016  . Migraines 04/24/2016   Social History   Tobacco Use  . Smoking status: Former Smoker    Packs/day: 1.00    Years: 10.00    Pack years: 10.00    Last attempt to quit: 05/18/1998    Years since quitting: 19.1  . Smokeless tobacco: Former Engineer, water Use Topics  . Alcohol use: Yes    Alcohol/week: 1.2 oz    Types: 2 Glasses of wine per week    Comment: occassional  . Drug use: No   Current Medications and Allergies:   Current Outpatient Medications:  .  clonazePAM (KLONOPIN) 0.5 MG tablet, TAKE 1 TABLET BY MOUTH TID, Disp: 30 tablet, Rfl: 2 .  escitalopram (LEXAPRO) 20 MG tablet, Take 20 mg by mouth daily., Disp: , Rfl:  .  Nutritional Supplements (ESTROVEN WEIGHT MANAGEMENT) CAPS, Take 1 capsule by mouth every other day., Disp: , Rfl:  .  meloxicam (MOBIC) 15 MG tablet, Take 1 tablet (15 mg total) by mouth daily., Disp: 30 tablet, Rfl: 0 .  phentermine (ADIPEX-P) 37.5 MG tablet, Take 1/2 tab for two weeks then start one daily, Disp: 30 tablet, Rfl: 1  No Known Allergies Review of Systems   Pertinent items are noted in the HPI. Otherwise, ROS is  negative.  Vitals:   Vitals:   07/18/17 1051  BP: 118/68  Pulse: 70  Temp: 97.6 F (36.4 C)  TempSrc: Oral  SpO2: 96%  Weight: 176 lb 3.2 oz (79.9 kg)  Height: 5\' 7"  (1.702 m)     Body mass index is 27.6 kg/m.  Physical Exam:   Physical Exam  Constitutional: She appears well-nourished.  HENT:  Head: Normocephalic and atraumatic.  Eyes: Pupils are equal, round, and reactive to light. EOM are normal.  Neck: Normal range of motion. Neck supple.  Cardiovascular: Normal rate, regular rhythm, normal heart sounds and intact distal pulses.  Pulmonary/Chest: Effort normal.  Abdominal: Soft.  Skin: Skin is warm.  Psychiatric: She has a normal mood and affect. Her behavior is normal.  Nursing note and vitals reviewed.   Assessment and Plan:   Adamari was seen today for follow-up.  Diagnoses and all orders for this visit:  Obesity (BMI 30-39.9) Comments: Discussed weight loss options. After discussion, patient would like to start below medication. Expectations, risks, and potential side effects reviewed. Follow up in 6 weeks for CPE with PAP and weight check. Orders: -     phentermine (ADIPEX-P) 37.5 MG tablet; Take 1/2 tab for two weeks then start one daily  Chronic pain of left knee Comments: Prefers Mobic. Has f/u with Dr. Berline Chough next week. Inhibiting  exercise.  Orders: -     meloxicam (MOBIC) 15 MG tablet; Take 1 tablet (15 mg total) by mouth daily.  Mixed stress and urge urinary incontinence Comments: Made appt with Urology today. S/P pelvic floor therapy.   Adjustment reaction with anxiety and depression Comments: Okay to refill medication today. Orders: -     clonazePAM (KLONOPIN) 0.5 MG tablet; TAKE 1 TABLET BY MOUTH TID   . Reviewed expectations re: course of current medical issues. . Discussed self-management of symptoms. . Outlined signs and symptoms indicating need for more acute intervention. . Patient verbalized understanding and all questions were  answered. Marland Kitchen. Health Maintenance issues including appropriate healthy diet, exercise, and smoking avoidance were discussed with patient. . See orders for this visit as documented in the electronic medical record. . Patient received an After Visit Summary.  Helane RimaErica Lavontae Cornia, DO [place] [place] 07/18/2017  Future Appointments  Date Time Provider Department Center  07/27/2017 10:00 AM Andrena Mewsigby, Michael D, DO [facility]   CMA served as scribe during this visit. History, Physical, and Plan performed by medical provider. The above documentation has been reviewed and is accurate and complete. Helane RimaErica Bryley Kovacevic, D.O.

## 2017-07-18 ENCOUNTER — Encounter: Payer: Self-pay | Admitting: Family Medicine

## 2017-07-18 ENCOUNTER — Ambulatory Visit: Payer: 59 | Admitting: Family Medicine

## 2017-07-18 VITALS — BP 118/68 | HR 70 | Temp 97.6°F | Ht 67.0 in | Wt 176.2 lb

## 2017-07-18 DIAGNOSIS — E669 Obesity, unspecified: Secondary | ICD-10-CM | POA: Diagnosis not present

## 2017-07-18 DIAGNOSIS — F4323 Adjustment disorder with mixed anxiety and depressed mood: Secondary | ICD-10-CM | POA: Diagnosis not present

## 2017-07-18 DIAGNOSIS — N3946 Mixed incontinence: Secondary | ICD-10-CM | POA: Diagnosis not present

## 2017-07-18 DIAGNOSIS — G8929 Other chronic pain: Secondary | ICD-10-CM

## 2017-07-18 DIAGNOSIS — M25562 Pain in left knee: Secondary | ICD-10-CM

## 2017-07-18 MED ORDER — CLONAZEPAM 0.5 MG PO TABS: ORAL_TABLET | ORAL | 2 refills | Status: DC

## 2017-07-18 MED ORDER — MELOXICAM 15 MG PO TABS
15.0000 mg | ORAL_TABLET | Freq: Every day | ORAL | 0 refills | Status: DC
Start: 2017-07-18 — End: 2017-09-24

## 2017-07-18 MED ORDER — PHENTERMINE HCL 37.5 MG PO TABS: ORAL_TABLET | ORAL | 1 refills | Status: DC

## 2017-07-18 NOTE — Patient Instructions (Signed)
[facility]: [address] [facility] [place], [place] [id number] Phone: [phone]   Appointment has been made for 7/25 arrive at 2:30

## 2017-07-27 ENCOUNTER — Telehealth: Payer: Self-pay | Admitting: Family Medicine

## 2017-07-27 ENCOUNTER — Ambulatory Visit: Payer: 59 | Admitting: Sports Medicine

## 2017-07-27 ENCOUNTER — Encounter: Payer: Self-pay | Admitting: Sports Medicine

## 2017-07-27 VITALS — BP 104/76 | HR 76 | Ht 67.0 in | Wt 174.4 lb

## 2017-07-27 DIAGNOSIS — G8929 Other chronic pain: Secondary | ICD-10-CM | POA: Diagnosis not present

## 2017-07-27 DIAGNOSIS — S83282A Other tear of lateral meniscus, current injury, left knee, initial encounter: Secondary | ICD-10-CM | POA: Diagnosis not present

## 2017-07-27 DIAGNOSIS — M25562 Pain in left knee: Secondary | ICD-10-CM

## 2017-07-27 NOTE — Patient Instructions (Addendum)
We are ordering an MRI for you today.  The imaging office will be calling you to schedule your appointment after we obtain authorization from your insurance company.   Please be sure you have signed up for MyChart so that we can get your results to you.  We will be in touch with you as soon as we can.  Please know, it can take up to 3-4 business days for the radiologist and Dr. Adon Gehlhausen to have time to review the results and determine the best appropriate action.  If there is something that appears to be surgical or needs a referral to other specialists we will let you know through MyChart or telephone.  Otherwise we will plan to schedule a follow up appointment with Dr. Ciel Chervenak once we have the results.   

## 2017-07-27 NOTE — Telephone Encounter (Signed)
Notified patient of message.  Patient verbalized understanding.  
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
## 2017-07-27 NOTE — Telephone Encounter (Signed)
The patient walked in stating that her new medication phentermine (ADIPEX-P) 37.5 mg tablet is not covered by her insurance and is too expensive. The patient requests to see if there is a comparable medication that is covered, or if this can be overridden. Please advise.

## 2017-07-27 NOTE — Telephone Encounter (Signed)
Do you want to change to something else?

## 2017-07-27 NOTE — Telephone Encounter (Signed)
This is the cheapest medication. Ask her to go to [facility] website to see the cheapest place to get it.

## 2017-08-01 ENCOUNTER — Ambulatory Visit
Admission: RE | Admit: 2017-08-01 | Discharge: 2017-08-01 | Disposition: A | Discharge status: Home / Self Care | Payer: 59 | Source: Ambulatory Visit | Attending: Sports Medicine | Admitting: Sports Medicine

## 2017-08-01 DIAGNOSIS — S83282A Other tear of lateral meniscus, current injury, left knee, initial encounter: Secondary | ICD-10-CM

## 2017-08-01 DIAGNOSIS — M25562 Pain in left knee: Principal | ICD-10-CM

## 2017-08-01 DIAGNOSIS — S82832A Other fracture of upper and lower end of left fibula, initial encounter for closed fracture: Secondary | ICD-10-CM | POA: Diagnosis not present

## 2017-08-01 DIAGNOSIS — G8929 Other chronic pain: Secondary | ICD-10-CM

## 2017-08-02 NOTE — Progress Notes (Signed)
Please schedule patient for follow-up to review the results.  She will need x-rays when she comes in.

## 2017-08-09 DIAGNOSIS — R3121 Asymptomatic microscopic hematuria: Secondary | ICD-10-CM | POA: Diagnosis not present

## 2017-08-09 DIAGNOSIS — R350 Frequency of micturition: Secondary | ICD-10-CM | POA: Diagnosis not present

## 2017-08-10 ENCOUNTER — Encounter: Payer: Self-pay | Admitting: Sports Medicine

## 2017-08-10 ENCOUNTER — Ambulatory Visit (INDEPENDENT_AMBULATORY_CARE_PROVIDER_SITE_OTHER): Payer: 59

## 2017-08-10 ENCOUNTER — Ambulatory Visit: Payer: 59 | Admitting: Sports Medicine

## 2017-08-10 VITALS — BP 106/80 | HR 76 | Ht 67.0 in | Wt 172.6 lb

## 2017-08-10 DIAGNOSIS — M25562 Pain in left knee: Secondary | ICD-10-CM

## 2017-08-10 DIAGNOSIS — S82832A Other fracture of upper and lower end of left fibula, initial encounter for closed fracture: Secondary | ICD-10-CM | POA: Diagnosis not present

## 2017-08-10 DIAGNOSIS — M899 Disorder of bone, unspecified: Secondary | ICD-10-CM

## 2017-08-10 DIAGNOSIS — G8929 Other chronic pain: Secondary | ICD-10-CM | POA: Diagnosis not present

## 2017-08-10 DIAGNOSIS — M949 Disorder of cartilage, unspecified: Secondary | ICD-10-CM

## 2017-08-10 DIAGNOSIS — M1712 Unilateral primary osteoarthritis, left knee: Secondary | ICD-10-CM | POA: Diagnosis not present

## 2017-08-10 MED ORDER — DICLOFENAC SODIUM 2 % TD SOLN: 1.0000 "application " | Freq: Two times a day (BID) | TRANSDERMAL | 2 refills | Status: DC

## 2017-08-10 MED ORDER — DICLOFENAC SODIUM 2 % TD SOLN: 1.0000 "application " | Freq: Two times a day (BID) | TRANSDERMAL | 0 refills | Status: AC

## 2017-08-10 NOTE — Progress Notes (Signed)
Veverly FellsMichael D. Delorise Shinerigby, DO  [facility] [place] [phone]  Daine FlorasSharon W Sirek - 57 y.o. female MRN [id number]  Date of birth: 07-04-1960  Visit Date: 08/10/2017  PCP: Helane RimaWallace, Erica, DO   Referred by: Helane RimaWallace, Erica, DO  Scribe(s) for today's visit: Stevenson ClinchBrandy Coleman, CMA  SUBJECTIVE:  Daine FlorasSharon W Wing is here for Follow-up (L knee pain)   Notes from initial visit on 06/15/17: Her L leg pain symptoms INITIALLY: Began May 18th 2019 after a fall while in [place]. She [place] while it was raining. She thinks that she hit the lateral aspect of her knee.  Described as moderate sharp pain, radiating to the L thigh, calf, and ankle.  Worsened with stairs and bending the knee (initially).  Improved with rest Additional associated symptoms include: She has tenderness to palpation on the posterior aspect of the knee. She also c/o swelling which resolved with ice but then comes back.    At this time symptoms are worsening compared to onset. Sx began to improve but then when she woke up this morning the pain was back to where it was initially.  She has been icing and elevating the leg with some relief. She has been taking Naproxen with some relief.   07/27/17: Compared to the last office visit on 06/15/17, her previously described L knee pain symptoms are improving w/ less pain noted in the L ant. knee.  She notes that she con't to have swelling in her L post knee.  She feels about 60% improved at this point. Current symptoms are mild-mod & are radiating to L post thigh. She has been using ice and Naproxen.  She had a depomedrol injection at her last visit that helped but she notes that it took up to 2 weeks to get relief.  08/10/2017: Compared to the last office visit, her previously described symptoms show no change, she reports having good days and bad days, today is a bad day.  Current symptoms are moderate & are radiating to the lateral aspect  of the thigh and lower leg, pain is described as a burning sensation. She also c/o pain and swelling along the posterior aspect of the knee.  She has been taking Naproxen and icing the knee as needed. She did ice her knee this morning.  She has MRI of her L knee 08/02/2017.   REVIEW OF SYSTEMS: Reports night time disturbances. Denies fevers, chills, or night sweats. Denies unexplained weight loss. Denies personal history of cancer. Denies changes in bowel or bladder habits. Denies recent unreported falls. Denies new or worsening dyspnea or wheezing. Denies headaches or dizziness.  Denies numbness, tingling or weakness  In the extremities.  Denies dizziness or presyncopal episodes Reports lower extremity edema    HISTORY:  Prior history reviewed and updated per electronic medical record.  Social History   Occupational History  . Not on file  Tobacco Use  . Smoking status: Former Smoker    Packs/day: 1.00    Years: 10.00    Pack years: 10.00    Last attempt to quit: 05/18/1998    Years since quitting: 19.3  . Smokeless tobacco: Former Engineer, waterUser  Substance and Sexual Activity  . Alcohol use: Yes    Alcohol/week: 2.0 standard drinks    Types: 2 Glasses of wine per week    Comment: occassional  . Drug use: No  . Sexual activity: Yes    Birth control/protection: Post-menopausal  Social History   Social History Narrative   Has 6 daughters; 2 are twins graduated from [employer]     DATA OBTAINED & REVIEWED:  No results for input(s): HGBA1C, LABURIC, CREATINE in the last 8760 hours. Marland Kitchen MRI 08/01/2017 of the left knee 1. Comminuted, partially united fracture of the fibular head. Recommend correlation with radiographs. 2. Focal partial-thickness chondral defect involving the patellar apex with suspected small adjacent delamination injury.  3. Intact ligamentous structures and no acute bony findings. 4. No meniscal tears.  X-rays on 08/10/2017 show interval  healing of a proximal fibular head fracture.  OBJECTIVE:  VS:  HT:5\' 7"  (170.2 cm)   WT:172 lb 9.6 oz (78.3 kg)  BMI:27.03    BP:106/80  HR:76bpm  TEMP: ( )  RESP:98 %   PHYSICAL EXAM: CONSTITUTIONAL: Well-developed, Well-nourished and In no acute distress PSYCHIATRIC: Alert & appropriately interactive. and Not depressed or anxious appearing. RESPIRATORY: No increased work of breathing and Trachea Midline EYES: Pupils are equal., EOM intact without nystagmus. and No scleral icterus.  VASCULAR EXAM: Warm and well perfused NEURO: unremarkable  MSK Exam: Left knee  Well aligned, no significant deformity. No overlying skin changes. Generalized tenderness about the knee is significantly improved.  She does have some still focal pain around the lateral aspect of the knee and over the fibular head but not focal.   RANGE OF MOTION & STRENGTH  Full flexion extension of the knee.  Minimal pain with weightbearing   SPECIALITY TESTING:  Varus stressing does cause some lateral pain but this is mild.  Most focal pain is with fibular head force.  Ligamentously stable     ASSESSMENT   1. Chronic pain of left knee   2. Other closed fracture of proximal end of left fibula, initial encounter   3. Osteochondral lesion     PLAN:  Pertinent additional documentation may be included in corresponding procedure notes, imaging studies, problem based documentation and patient instructions.  Procedures:  . None  Medications:  Meds ordered this encounter  Medications  . Diclofenac Sodium (PENNSAID) 2 % SOLN    Sig: Place 1 application onto the skin 2 (two) times daily for 1 day.    Dispense:  8 g    Refill:  0  . Diclofenac Sodium (PENNSAID) 2 % SOLN    Sig: Place 1 application onto the skin 2 (two) times daily.    Dispense:  112 g    Refill:  2    Home Phone      [phone] Mobile          [phone]    Discussion/Instructions: No problem-specific Assessment & Plan notes found for  this encounter.  . Somewhat surprising finding given the mechanism and the duration of her symptoms.  She is showing slow steady improvement we will have her use topical anti-inflammatories . Discussed red flag symptoms that warrant earlier emergent evaluation and patient voices understanding. . Activity modifications and the importance of avoiding exacerbating activities (limiting pain to no more than a 4 / 10 during or following activity) recommended and discussed. Marland Kitchen RICE (Rest, ICE, Compression, Elevation) principles reviewed with the patient. . >50% of this 25 minutes minute visit spent in direct patient counseling and/or coordination of care. Discussion was focused on education regarding the in discussing the pathoetiology and anticipated clinical course of the above condition.  Follow-up:  . Return in about 1 month (around 09/07/2017).  .      CMA/ATC served as  scribe during this visit. History, Physical, and Plan performed by medical provider. Documentation and orders reviewed and attested to.      Andrena Mews, DO     Sports Medicine Physician

## 2017-08-10 NOTE — Patient Instructions (Addendum)
[facility] instructions for Duexis, Pennsaid and Vimovo:  Your prescription will be filled through a mail order pharmacy.  It is typically [facility] but may vary depending on where you live.  You will receive a phone call from them which will typically come from a [phone] phone number.  You must speak directly to them to have this medication filled.  When the pharmacy calls, they will need your mailing address (for overnight shipment of the medication) andy they will need payment information if you have a copay (typically no more than $10). If you have not heard from them 2-3 days after your appointment with Dr. Rigby, contact us at the office ([phone]) or through MyChart so we can reach back out to the pharmacy.    Pennsaid instructions: You have been given a sample/prescription for Pennsaid, a topical medication.     You are to apply this gel to your injured body part twice daily (morning and evening).   A little goes a long way so you can use about a pea-sized amount for each area.   Spread this small amount over the area into a thin film and let it dry.   Be sure that you do not rub the gel into your skin for more than 10 or 15 seconds otherwise it can irritate you skin.    Once you apply the gel, please do not put any other lotion or clothing in contact with that area for 30 minutes to allow the gel to absorb into your skin.   Some people are sensitive to the medication and can develop a sunburn-like rash.  If you have only mild symptoms it is okay to continue to use the medication but if you have any breakdown of your skin you should discontinue its use and please let us know.   If you have been written a prescription for Pennsaid, you will receive a pump bottle of this topical gel through a mail order pharmacy.  The instructions on the bottle will say to apply two pumps twice a day which may be too much gel for your particular area so use the pea-sized amount as your  guide.  

## 2017-08-28 DIAGNOSIS — R3121 Asymptomatic microscopic hematuria: Secondary | ICD-10-CM | POA: Diagnosis not present

## 2017-08-31 DIAGNOSIS — R3121 Asymptomatic microscopic hematuria: Secondary | ICD-10-CM | POA: Diagnosis not present

## 2017-08-31 DIAGNOSIS — R3129 Other microscopic hematuria: Secondary | ICD-10-CM | POA: Diagnosis not present

## 2017-09-20 ENCOUNTER — Ambulatory Visit: Payer: 59 | Admitting: Sports Medicine

## 2017-09-20 ENCOUNTER — Ambulatory Visit (INDEPENDENT_AMBULATORY_CARE_PROVIDER_SITE_OTHER): Payer: 59

## 2017-09-20 ENCOUNTER — Encounter: Payer: Self-pay | Admitting: Sports Medicine

## 2017-09-20 VITALS — BP 100/72 | HR 71 | Ht 67.0 in | Wt 175.6 lb

## 2017-09-20 DIAGNOSIS — M25562 Pain in left knee: Secondary | ICD-10-CM

## 2017-09-20 DIAGNOSIS — S82832D Other fracture of upper and lower end of left fibula, subsequent encounter for closed fracture with routine healing: Secondary | ICD-10-CM | POA: Diagnosis not present

## 2017-09-20 DIAGNOSIS — G8929 Other chronic pain: Secondary | ICD-10-CM | POA: Diagnosis not present

## 2017-09-20 DIAGNOSIS — S82832A Other fracture of upper and lower end of left fibula, initial encounter for closed fracture: Secondary | ICD-10-CM

## 2017-09-20 NOTE — Progress Notes (Signed)
Sandra Mccarthy. Sandra Mccarthy at [facility] [phone]  Sandra Mccarthy - 57 y.o. female MRN [id number]  Date of birth: [birth year]  Visit Date: 09/20/2017  PCP: Helane Rima, DO   Referred by: Helane Rima, DO  Scribe(s) for today's visit: Stevenson Clinch, CMA  SUBJECTIVE:  Sandra Mccarthy is here for Follow-up (L knee pain)  06/15/17: Her L leg pain symptoms INITIALLY: Began May 18th 2019 after a fall while in [place]. [place] while it was raining. She thinks that she hit the lateral aspect of her knee.  Described as moderate sharp pain, radiating to the L thigh, calf, and ankle.  Worsened with stairs and bending the knee (initially).  Improved with rest Additional associated symptoms include: She has tenderness to palpation on the posterior aspect of the knee. She also c/o swelling which resolved with ice but then comes back.    At this time symptoms are worsening compared to onset. Sx began to improve but then when she woke up this morning the pain was back to where it was initially.  She has been icing and elevating the leg with some relief. She has been taking Naproxen with some relief.   07/27/17: Compared to the last office visit on 06/15/17, her previously described L knee pain symptoms are improving w/ less pain noted in the L ant. knee.  She notes that she con't to have swelling in her L post knee.  She feels about 60% improved at this point. Current symptoms are mild-mod & are radiating to L post thigh. She has been using ice and Naproxen.  She had a depomedrol injection at her last visit that helped but she notes that it took up to 2 weeks to get relief.  08/10/2017: Compared to the last office visit, her previously described symptoms show no change, she reports having good days and bad days, today is a bad day.  Current symptoms are moderate & are radiating to the lateral aspect of the thigh and lower leg,  pain is described as a burning sensation. She also c/o pain and swelling along the posterior aspect of the knee.  She has been taking Naproxen and icing the knee as needed. She did ice her knee this morning.  She has MRI of her L knee 08/02/2017.  09/20/2017: Compared to the last office visit, her previously described symptoms are improving. Once or twice a week sx will flare up and are more moderate-severe.  Current symptoms are mild-moderate (3-6/10) & are radiating to the lateral aspect of the thigh and lower leg. She has some pain at the proximal anteromedial tibia She has been using Pennsaid, taking Naproxen, and icing prn with some relief.  REVIEW OF SYSTEMS: Denies night time disturbances. Denies fevers, chills, or night sweats. Denies unexplained weight loss. Denies personal history of cancer. Denies changes in bowel or bladder habits. Denies recent unreported falls. Denies new or worsening dyspnea or wheezing. Denies headaches or dizziness.  Denies numbness, tingling or weakness  In the extremities.  Denies dizziness or presyncopal episodes Reports lower extremity edema    HISTORY:  Prior history reviewed and updated per electronic medical record.  Social History   Occupational History  . Not on file  Tobacco Use  . Smoking status: Former Smoker    Packs/day: 1.00    Years: 10.00    Pack years: 10.00    Last attempt to quit: 05/18/1998  Years since quitting: 19.3  . Smokeless tobacco: Former Engineer, water and Sexual Activity  . Alcohol use: Yes    Alcohol/week: 2.0 standard drinks    Types: 2 Glasses of wine per week    Comment: occassional  . Drug use: No  . Sexual activity: Yes    Birth control/protection: Post-menopausal   Social History   Social History Narrative   Has 6 daughters; 2 are twins graduated from [employer]     DATA OBTAINED & REVIEWED:  No results for input(s): HGBA1C, LABURIC, CREATINE in the last 8760 hours. Marland Kitchen MRI  08/01/2017 of the left knee 1. Comminuted, partially united fracture of the fibular head. Recommend correlation with radiographs. 2. Focal partial-thickness chondral defect involving the patellar apex with suspected small adjacent delamination injury.  3. Intact ligamentous structures and no acute bony findings. 4. No meniscal tears.  X-rays on 08/10/2017 show interval healing of a proximal fibular head fracture.  OBJECTIVE:  VS:  HT:5\' 7"  (170.2 cm)   WT:175 lb 9.6 oz (79.7 kg)  BMI:27.5    BP:100/72  HR:71bpm  TEMP: ( )  RESP:95 %   PHYSICAL EXAM: CONSTITUTIONAL: Well-developed, Well-nourished and In no acute distress PSYCHIATRIC: Alert & appropriately interactive. and Not depressed or anxious appearing. RESPIRATORY: No increased work of breathing and Trachea Midline EYES: Pupils are equal., EOM intact without nystagmus. and No scleral icterus.  VASCULAR EXAM: Warm and well perfused NEURO: unremarkable  MSK Exam: Left knee  Well aligned, no significant deformity. No overlying skin changes. Generalized tenderness about the knee is significantly improved.  She does have some still focal pain around the lateral aspect of the knee and over the fibular head but not focal.   RANGE OF MOTION & STRENGTH  Full flexion extension of the knee.  No focal pain with weightbearing   SPECIALITY TESTING:  Varus stressing causes only minimal lateral pain.  This is improved  Ligamentously stable     ASSESSMENT   1. Chronic pain of left knee     PLAN:  Pertinent additional documentation may be included in corresponding procedure notes, imaging studies, problem based documentation and patient instructions.  Procedures:  . None  Medications:  No orders of the defined types were placed in this encounter.  Discussion/Instructions: No problem-specific Assessment & Plan notes found for this encounter.  . Doing better following her injury although continues to have some mild symptoms.   Continue with topical anti-inflammatories . Discussed that this should likely continue to completely resolve over the next several weeks to months.  If any persistent symptoms she will follow-up overall doing well. . Body Helix Compression Sleeve provided today per AVS . Discussed red flag symptoms that warrant earlier emergent evaluation and patient voices understanding. . Activity modifications and the importance of avoiding exacerbating activities (limiting pain to no more than a 4 / 10 during or following activity) recommended and discussed. Marland Kitchen RICE (Rest, ICE, Compression, Elevation) principles reviewed with the patient.  Follow-up:  . Return if symptoms worsen or fail to improve.      CMA/ATC served as Neurosurgeon during this visit. History, Physical, and Plan performed by medical provider. Documentation and orders reviewed and attested to.      Andrena Mews, DO    [facility] Physician

## 2017-09-20 NOTE — Patient Instructions (Signed)
I recommend you obtained a compression sleeve to help with your joint problems. There are many options on the market however I recommend obtaining a knee Body Helix compression sleeve.  You can find information (including how to appropriate measure yourself for sizing) can be found at www.[email or web address].ch.  Many of these products are health savings account (HSA) eligible.   You can use the compression sleeve at any time throughout the day but is most important to use while being active as well as for 2 hours post-activity.   It is appropriate to ice following activity with the compression sleeve in place.

## 2017-09-24 ENCOUNTER — Telehealth: Payer: Self-pay | Admitting: Family Medicine

## 2017-09-24 ENCOUNTER — Other Ambulatory Visit: Payer: Self-pay | Admitting: Family Medicine

## 2017-09-24 ENCOUNTER — Encounter: Payer: Self-pay | Admitting: Physician Assistant

## 2017-09-24 ENCOUNTER — Ambulatory Visit: Payer: 59 | Admitting: Physician Assistant

## 2017-09-24 VITALS — BP 108/78 | HR 88 | Temp 98.8°F | Wt 174.0 lb

## 2017-09-24 DIAGNOSIS — G8929 Other chronic pain: Secondary | ICD-10-CM

## 2017-09-24 DIAGNOSIS — M25562 Pain in left knee: Principal | ICD-10-CM

## 2017-09-24 DIAGNOSIS — J069 Acute upper respiratory infection, unspecified: Secondary | ICD-10-CM

## 2017-09-24 DIAGNOSIS — J029 Acute pharyngitis, unspecified: Secondary | ICD-10-CM | POA: Diagnosis not present

## 2017-09-24 LAB — POCT RAPID STREP A (OFFICE): Rapid Strep A Screen: NEGATIVE

## 2017-09-24 MED ORDER — METHYLPREDNISOLONE ACETATE 80 MG/ML IJ SUSP
80.0000 mg | Freq: Once | INTRAMUSCULAR | Status: AC
  Administered 2017-09-24: 80 mg via INTRAMUSCULAR

## 2017-09-24 MED ORDER — HYDROCODONE-HOMATROPINE 5-1.5 MG/5ML PO SYRP: 5.0000 mL | ORAL_SOLUTION | Freq: Four times a day (QID) | ORAL | 0 refills | Status: DC | PRN

## 2017-09-24 MED ORDER — AZITHROMYCIN 250 MG PO TABS: ORAL_TABLET | ORAL | 0 refills | Status: DC

## 2017-09-24 NOTE — Patient Instructions (Signed)
It was great to see you!  Use medication as prescribed: Azithromycin antibiotic, Hycodan cough syrup  Consider purchasing Delsym cough syrup to use during the day  Push fluids and get plenty of rest. Please return if you are not improving as expected, or if you have high fevers (>101.5) or difficulty swallowing or worsening productive cough.  Call clinic with questions.  I hope you start feeling better soon!
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
## 2017-09-24 NOTE — Telephone Encounter (Signed)
Copied from CRM [id number]. Topic: Quick Communication - Rx Refill/Question >> Sep 24, 2017  3:36 PM Luanna Cole wrote: Medication: escitalopram (LEXAPRO) 20 MG tablet [[id number]] meloxicam (MOBIC) 15 MG tablet [[id number]]   Has the patient contacted their pharmacy? Yes  Preferred Pharmacy (with phone number or street name):[id number] - [place], [place] - [address] AT [facility] [address] [facility] [address] [phone] (Phone) [phone] (Fax)  Agent: Please be advised that RX refills may take up to 3 business days. We ask that you follow-up with your pharmacy.

## 2017-09-25 ENCOUNTER — Encounter: Payer: Self-pay | Admitting: Surgical

## 2017-09-25 MED ORDER — ESCITALOPRAM OXALATE 20 MG PO TABS: 20.0000 mg | ORAL_TABLET | Freq: Every day | ORAL | 2 refills | Status: DC

## 2017-09-25 NOTE — Telephone Encounter (Signed)
Okay refill. 

## 2017-09-25 NOTE — Telephone Encounter (Signed)
Refill of Lexapro by Historical provider  LRF 04/24/16  LOV 09/24/17 [facility] called by NT; pt made aware Mobic ready at pharmacy.

## 2017-09-25 NOTE — Addendum Note (Signed)
Addended by: Dorian Pod on: 09/25/2017 12:24 PM   Modules accepted: Orders

## 2017-09-25 NOTE — Telephone Encounter (Signed)
Prescription sent to the pharmacy. Sent patient a message on My chart.

## 2017-09-25 NOTE — Telephone Encounter (Signed)
Please advise on refill. Historical provider.  

## 2017-09-28 ENCOUNTER — Telehealth: Payer: Self-pay | Admitting: Family Medicine

## 2017-09-28 NOTE — Telephone Encounter (Signed)
Please advise 

## 2017-09-28 NOTE — Telephone Encounter (Signed)
Copied from CRM [id number]. Topic: General - Other >> Sep 28, 2017 11:39 AM [name], [name] wrote:  Pt was in on 09/24/17 and saw [name] is still not feeling well and said [place] had the [place] Virus and is asking if she might need to be tested.

## 2017-09-30 NOTE — Telephone Encounter (Signed)
West Nile very low likelihood. Call to check in with her. If not improved, come in for recheck. Okay to work into my schedule.

## 2017-10-01 ENCOUNTER — Encounter: Payer: Self-pay | Admitting: Sports Medicine

## 2017-10-01 NOTE — Telephone Encounter (Signed)
Left message to return call to our office.  

## 2017-10-02 ENCOUNTER — Encounter: Payer: Self-pay | Admitting: Sports Medicine

## 2017-10-09 NOTE — Telephone Encounter (Signed)
Called patient l/m to call office  

## 2017-10-17 NOTE — Telephone Encounter (Signed)
Letter mailed

## 2017-10-17 NOTE — Telephone Encounter (Signed)
Okay 

## 2017-10-17 NOTE — Telephone Encounter (Signed)
Have called x 3 not able to reach patient ok to mail letter?

## 2018-02-01 ENCOUNTER — Encounter: Payer: Self-pay | Admitting: Family Medicine

## 2018-02-01 ENCOUNTER — Ambulatory Visit: Payer: 59 | Admitting: Family Medicine

## 2018-02-01 VITALS — BP 100/62 | HR 87 | Temp 98.0°F | Ht 67.0 in | Wt 169.0 lb

## 2018-02-01 DIAGNOSIS — F902 Attention-deficit hyperactivity disorder, combined type: Secondary | ICD-10-CM | POA: Diagnosis not present

## 2018-02-01 MED ORDER — AMPHETAMINE-DEXTROAMPHET ER 20 MG PO CP24: 20.0000 mg | ORAL_CAPSULE | ORAL | 0 refills | Status: DC

## 2018-02-01 NOTE — Patient Instructions (Signed)
15 Tips to a Better Night's Sleep   Practice "good sleep hygiene". Here are some tips for you to try:   1. No reading or watching TV in bed. These are waking activities.  2. Go to bed when you're sleepy-tired, not when it's time to go to bed by habit.  3. Wind down during the second half of the evening before bedtime. Don't get involved in any kind of anxiety-provoking activities of thought 90 minutes before retiring to bed.  4. Do some breathing exercises or try to relax major muscle groups. Start at the toes and work up the body all the way to the forehead.  5. Your bed is for sleeping, so if you cannot sleep after 15-20 minutes in bed, get up and do something relaxing.  6. Have your room cool instead of warm.  7. Don't count sheep--counting is stimulating.  8. Exercise in the afternoon or early evening, but no later than three hours before bedtime.  9. Don't overeat or eat two to three hours before bedtime.  10. Try not to nap during the day.  11. If you awake in the middle of the night and can't get back to sleep within 30 minutes, get up and do something relaxing (no TV or reading anything stimulating).  12. Have no caffeine, alcohol or cigarettes two to three hours before retiring to bed.  13. If you have disturbing dreams or nightmares repeatedly, try to add an ending you enjoy or like better.  14. Keep a sleep journal. Thirty minutes before you go to bed, write down your concerns and hopes. It frees up your sleep from processing your dilemmas.  15. Listen to calming music or recorded sounds (ocean, forest, birds, crickets, brook) before bed.   If sleep problems persist, contact your physician or mental health professional. Let them know what is happening in your life. Your problem may have either organic or psychological contributors. Sleep disorders are considered chronic if they persist over more than one month.   

## 2018-02-02 NOTE — Progress Notes (Signed)
Sandra Mccarthy is a 58 y.o. female is here for follow up.  History of Present Illness:   HPI:    Adult ADHD Self Report Scale (most recent)    Adult ADHD Self-Report Scale (ASRS-v1.1) Symptom Checklist - 02/01/18 1453      Part A   1. How often do you have trouble wrapping up the final details of a project, once the challenging parts have been done?  (!) Sometimes  2. How often do you have difficulty getting things done in order when you have to do a task that requires organization?  (!) Very Often    3. How often do you have problems remembering appointments or obligations?  (!) Very Often  4. When you have a task that requires a lot of thought, how often do you avoid or delay getting started?  (!) Very Often    5. How often do you fidget or squirm with your hands or feet when you have to sit down for a long time?  (!) Often  6. How often do you feel overly active and compelled to do things, like you were driven by a motor?  (!) Very Often      Part B   7. How often do you make careless mistakes when you have to work on a boring or difficult project?  (!) Often  8. How often do you have difficulty keeping your attention when you are doing boring or repetitive work?  (!) Very Often    9. How often do you have difficulty concentrating on what people say to you, even when they are speaking to you directly?  (!) Often  10. How often do you misplace or have difficulty finding things at home or at work?  (!) Very Often    11. How often are you distracted by activity or noise around you?  Sometimes  12. How often do you leave your seat in meetings or other situations in which you are expected to remain seated?  Rarely    13. How often do you feel restless or fidgety?  (!) Very Often  14. How often do you have difficulty unwinding and relaxing when you have time to yourself?  (!) Very Often    15. How often do you find yourself talking too much when you are in social situations?  Sometimes  16. When  you are in a conversation, how often do you find yourself finishing the sentences of the people you are talking to, before they can finish them themselves?  (!) Sometimes    17. How often do you have difficulty waiting your turn in situations when turn taking is required?  (!) Often  18. How often do you interrupt others when they are busy?  Rarely      Review of Systems  Constitutional: Negative for chills, fever, malaise/fatigue and weight loss.  Respiratory: Negative for cough, shortness of breath and wheezing.   Cardiovascular: Negative for chest pain, palpitations and leg swelling.  Gastrointestinal: Negative for abdominal pain, constipation, diarrhea, nausea and vomiting.  Genitourinary: Negative for dysuria and urgency.  Musculoskeletal: Negative for joint pain and myalgias.  Skin: Negative for rash.  Neurological: Negative for dizziness and headaches.  Psychiatric/Behavioral: Negative for depression, substance abuse and suicidal ideas. The patient has insomnia. The patient is not nervous/anxious.    Health Maintenance Due  Topic Date Due  . INFLUENZA VACCINE  08/16/2017   No flowsheet data found. PMHx, SurgHx, SocialHx, FamHx, Medications,  and Allergies were reviewed in the Visit Navigator and updated as appropriate.   Patient Active Problem List   Diagnosis Date Noted  . Knee pain 01/29/2017  . 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 Adjustment reaction with anxiety and depression 01/20/2017  . Chronic cervical pain 01/20/2017  . Chronic lumbar pain 01/20/2017  . Arthritis 04/24/2016  . Migraines 04/24/2016   Social History   Tobacco Use  . Smoking status: Former Smoker    Packs/day: 1.00    Years: 10.00    Pack years: 10.00    Last attempt to quit: 05/18/1998    Years since quitting: 19.7  . Smokeless tobacco: Former Engineer, waterUser  Substance Use Topics  . Alcohol use: Yes    Alcohol/week: 2.0 standard drinks     Types: 2 Glasses of wine per week    Comment: occassional  . Drug use: No   Current Medications and Allergies:   .  clonazePAM (KLONOPIN) 0.5 MG tablet, TAKE 1 TABLET BY MOUTH TID, Disp: 30 tablet, Rfl: 2 TAKING RARELY .  escitalopram (LEXAPRO) 20 MG tablet, Take 1 tablet (20 mg total) by mouth daily., Disp: 30 tablet, Rfl: 2 .  meloxicam (MOBIC) 15 MG tablet, TAKE 1 TABLET(15 MG) BY MOUTH DAILY, Disp: 30 tablet, Rfl: 0 .  Nutritional Supplements (ESTROVEN WEIGHT MANAGEMENT) CAPS, Take 1 capsule by mouth every other day., Disp: , Rfl:   No Known Allergies   Review of Systems   Pertinent items are noted in the HPI. Otherwise, a complete ROS is negative.  Vitals:   Vitals:   02/01/18 1430  BP: 100/62  Pulse: 87  Temp: 98 F (36.7 C)  TempSrc: Oral  SpO2: 96%  Weight: 169 lb (76.7 kg)  Height: 5\' 7"  (1.702 m)     Body mass index is 26.47 kg/m.  Physical Exam:   Physical Exam Vitals signs and nursing note reviewed.  HENT:     Head: Normocephalic and atraumatic.  Eyes:     Pupils: Pupils are equal, round, and reactive to light.  Neck:     Musculoskeletal: Normal range of motion and neck supple.  Cardiovascular:     Rate and Rhythm: Normal rate and regular rhythm.     Heart sounds: Normal heart sounds.  Pulmonary:     Effort: Pulmonary effort is normal.  Abdominal:     Palpations: Abdomen is soft.  Skin:    General: Skin is warm.  Psychiatric:        Behavior: Behavior normal.    Assessment and Plan:   Sandra Mccarthy was seen today for consult.  Diagnoses and all orders for this visit:  Attention deficit hyperactivity disorder (ADHD), combined type -     amphetamine-dextroamphetamine (ADDERALL XR) 20 MG 24 hr capsule; Take 1 capsule (20 mg total) by mouth every morning.  Plan:  1. Goals:  What improvements would you most like to see? Decrease symptoms of ADHD that are impairing work and/or socialization. 2. Plans to reach these goals: Treatment with medication,  Individual therapy to address problem behaviors associated with ADHD, Improve sleep hygiene and set earlier bedtime, Reduce and monitor all screen/media time, Improve nutrition in diet and Increase daily exercise. 3. Medication Management:  Stimulant: SEE ORDERS. 4. Discussion: The benefits of stimulant medication treatment appear to outweigh the current risks. We discussed risks and benefits of different medications. I recommended and discussed appropriate dietary modifications and routine exercise. Stimulant medication management, careful titration,  and dose optimization of stimulant medication was discussed as the treatment option with the best scientific evidence helping reduce ADHD symptoms. There is no cure for ADHD. Stimulant medication side effects: The temporary side effects of stimulants including decreased appetite, sleep disturbance, mood changes, personality changes, ticks, increases in heart rate and blood pressure, abdominal pain, nausea, vomiting, and dry mouth were discussed.    . Orders and follow up as documented in EpicCare, reviewed diet, exercise and weight control, cardiovascular risk and specific lipid/LDL goals reviewed, reviewed medications and side effects in detail.  . Reviewed expectations re: course of current medical issues. . Outlined signs and symptoms indicating need for more acute intervention. . Patient verbalized understanding and all questions were answered. . Patient received an After Visit Summary.  Helane Rima, DO [place] 02/02/2018
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
## 2018-03-04 ENCOUNTER — Ambulatory Visit: Payer: Self-pay | Admitting: *Deleted

## 2018-03-04 NOTE — Telephone Encounter (Signed)
See note

## 2018-03-04 NOTE — Telephone Encounter (Signed)
Called patient she also needs chest xray worked in [employer].

## 2018-03-04 NOTE — Telephone Encounter (Signed)
Summary: Medication Reaction   Pt stated she stopped taking her adderal because it was causing her to grind her teeth. She would like to know if an alternative medication can be tried.     Side effects from Adderal- patient had been on Adderal for a couple weeks- she has started to grind teeth during waking hours- she finally realized it was the medication. Patient states the medication did help her with her focus- she wants to know if there is another medication she can try.  Reason for Disposition . Caller has NON-URGENT medication question about med that PCP prescribed and triager unable to answer question  Answer Assessment - Initial Assessment Questions 1. SYMPTOMS: "Do you have any symptoms?"     Grinding teeth during waking hours, insomnia- history- not sure if medication made it worse. 2. SEVERITY: If symptoms are present, ask "Are they mild, moderate or severe?"  Protocols used: MEDICATION QUESTION CALL-A-AH

## 2018-03-05 ENCOUNTER — Ambulatory Visit: Payer: 59 | Admitting: Family Medicine

## 2018-03-05 ENCOUNTER — Ambulatory Visit (INDEPENDENT_AMBULATORY_CARE_PROVIDER_SITE_OTHER): Payer: 59

## 2018-03-05 ENCOUNTER — Encounter: Payer: Self-pay | Admitting: Family Medicine

## 2018-03-05 VITALS — BP 102/70 | HR 70 | Temp 97.6°F | Ht 67.0 in | Wt 166.8 lb

## 2018-03-05 DIAGNOSIS — M7121 Synovial cyst of popliteal space [Baker], right knee: Secondary | ICD-10-CM

## 2018-03-05 DIAGNOSIS — R5383 Other fatigue: Secondary | ICD-10-CM | POA: Diagnosis not present

## 2018-03-05 DIAGNOSIS — R7611 Nonspecific reaction to tuberculin skin test without active tuberculosis: Secondary | ICD-10-CM | POA: Diagnosis not present

## 2018-03-05 DIAGNOSIS — F902 Attention-deficit hyperactivity disorder, combined type: Secondary | ICD-10-CM

## 2018-03-05 DIAGNOSIS — Z13220 Encounter for screening for lipoid disorders: Secondary | ICD-10-CM

## 2018-03-05 DIAGNOSIS — R5381 Other malaise: Secondary | ICD-10-CM

## 2018-03-05 LAB — COMPREHENSIVE METABOLIC PANEL
ALT: 17 U/L (ref 0–35)
AST: 21 U/L (ref 0–37)
Albumin: 4.1 g/dL (ref 3.5–5.2)
Alkaline Phosphatase: 95 U/L (ref 39–117)
BUN: 19 mg/dL (ref 6–23)
CO2: 29 mEq/L (ref 19–32)
Calcium: 9.3 mg/dL (ref 8.4–10.5)
Chloride: 105 mEq/L (ref 96–112)
Creatinine, Ser: 0.69 mg/dL (ref 0.40–1.20)
GFR: 87.86 mL/min (ref >60.00)
Glucose, Bld: 90 mg/dL (ref 70–99)
Potassium: 4.1 mEq/L (ref 3.5–5.1)
Sodium: 141 mEq/L (ref 135–145)
Total Bilirubin: 0.4 mg/dL (ref 0.2–1.2)
Total Protein: 6.5 g/dL (ref 6.0–8.3)

## 2018-03-05 LAB — CBC WITH DIFFERENTIAL/PLATELET
Basophils Absolute: 0 10*3/uL (ref 0.0–0.1)
Basophils Relative: 0.5 % (ref 0.0–3.0)
Eosinophils Absolute: 0.1 10*3/uL (ref 0.0–0.7)
Eosinophils Relative: 1.5 % (ref 0.0–5.0)
HCT: 38.6 % (ref 36.0–46.0)
Hemoglobin: 13.1 g/dL (ref 12.0–15.0)
Lymphocytes Relative: 33.8 % (ref 12.0–46.0)
Lymphs Abs: 1.8 10*3/uL (ref 0.7–4.0)
MCHC: 34 g/dL (ref 30.0–36.0)
MCV: 93.5 fl (ref 78.0–100.0)
Monocytes Absolute: 0.3 10*3/uL (ref 0.1–1.0)
Monocytes Relative: 6.1 % (ref 3.0–12.0)
Neutro Abs: 3.1 10*3/uL (ref 1.4–7.7)
Neutrophils Relative %: 58.1 % (ref 43.0–77.0)
Platelets: 270 10*3/uL (ref 150.0–400.0)
RBC: 4.13 Mil/uL (ref 3.87–5.11)
RDW: 12.9 % (ref 11.5–15.5)
WBC: 5.4 10*3/uL (ref 4.0–10.5)

## 2018-03-05 LAB — LIPID PANEL
Cholesterol: 191 mg/dL (ref 0–200)
HDL: 54.1 mg/dL (ref >39.00)
LDL Cholesterol: 100 mg/dL — ABNORMAL HIGH (ref 0–99)
NonHDL: 137.1
Total CHOL/HDL Ratio: 4
Triglycerides: 187 mg/dL — ABNORMAL HIGH (ref 0.0–149.0)
VLDL: 37.4 mg/dL (ref 0.0–40.0)

## 2018-03-05 LAB — TSH: TSH: 1.57 u[IU]/mL (ref 0.35–4.50)

## 2018-03-05 MED ORDER — LISDEXAMFETAMINE DIMESYLATE 20 MG PO CHEW: 1.0000 | CHEWABLE_TABLET | Freq: Every day | ORAL | 0 refills | Status: DC

## 2018-03-05 NOTE — Progress Notes (Signed)
Sandra Mccarthy is a 58 y.o. female is here for follow up.  History of Present Illness:   HPI: See Assessment and Plan section for Problem Based Charting of issues discussed today.   Health Maintenance Due  Topic Date Due  . INFLUENZA VACCINE  08/16/2017   Depression screen PHQ 2/9 03/05/2018  Decreased Interest 0  Down, Depressed, Hopeless 1  PHQ - 2 Score 1  Altered sleeping 3  Tired, decreased energy 2  Change in appetite 2  Feeling bad or failure about yourself  1  Trouble concentrating 3  Moving slowly or fidgety/restless 1  Suicidal thoughts 0  PHQ-9 Score 13  Difficult doing work/chores Very difficult   PMHx, SurgHx, SocialHx, FamHx, Medications, and Allergies were reviewed in the Visit Navigator and updated as appropriate.   Patient Active Problem List   Diagnosis Date Noted  . Attention deficit hyperactivity disorder (ADHD), combined type 03/14/2018  . Synovial cyst of right popliteal space 03/14/2018  . 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/2018  . Knee pain 01/29/2017  . 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 Adjustment reaction with anxiety and depression 01/20/2017  . Chronic cervical pain 01/20/2017  . Chronic lumbar pain 01/20/2017  . Arthritis 04/24/2016  . Migraines 04/24/2016   Social History   Tobacco Use  . Smoking status: Former Smoker    Packs/day: 1.00    Years: 10.00    Pack years: 10.00    Last attempt to quit: 05/18/1998    Years since quitting: 19.8  . Smokeless tobacco: Former Engineer, water Use Topics  . Alcohol use: Yes    Alcohol/week: 2.0 standard drinks    Types: 2 Glasses of wine per week    Comment: occassional  . Drug use: No   Current Medications and Allergies:   Current Outpatient Medications:  .  clonazePAM (KLONOPIN) 0.5 MG tablet, TAKE 1 TABLET BY MOUTH TID, Disp: 30 tablet, Rfl: 2 .  escitalopram (LEXAPRO) 20 MG tablet, Take 1 tablet (20 mg total) by mouth daily.,  Disp: 30 tablet, Rfl: 2 .  meloxicam (MOBIC) 15 MG tablet, TAKE 1 TABLET(15 MG) BY MOUTH DAILY, Disp: 30 tablet, Rfl: 0 .  Nutritional Supplements (ESTROVEN WEIGHT MANAGEMENT) CAPS, Take 1 capsule by mouth every other day., Disp: , Rfl:   No Known Allergies   Review of Systems   Pertinent items are noted in the HPI. Otherwise, ROS is negative.  Vitals:   Vitals:   03/05/18 0847  BP: 102/70  Pulse: 70  Temp: 97.6 F (36.4 C)  TempSrc: Oral  SpO2: 95%  Weight: 166 lb 12.8 oz (75.7 kg)  Height: 5\' 7"  (1.702 m)     Body mass index is 26.12 kg/m.  Physical Exam:   Physical Exam Vitals signs and nursing note reviewed.  HENT:     Head: Normocephalic and atraumatic.  Eyes:     Pupils: Pupils are equal, round, and reactive to light.  Neck:     Musculoskeletal: Normal range of motion and neck supple.  Cardiovascular:     Rate and Rhythm: Normal rate and regular rhythm.     Heart sounds: Normal heart sounds.  Pulmonary:     Effort: Pulmonary effort is normal.  Abdominal:     Palpations: Abdomen is soft.  Skin:    General: Skin is warm.  Psychiatric:        Behavior: Behavior normal.    Assessment and Plan:  Synovial cyst of right popliteal space Slightly worse. No real pain, except with lots of pressure from sitting. No redness. Discussed potential treatments to this benign issue. Will hold.   Positive TB test Okay to continue CXR for work.  Attention deficit hyperactivity disorder (ADHD), combined type Would li,e to trial another medication. Heard that Vyvanse works well. Will trial. Precautions reviewed. Prior authorization will be needed.   Orders Placed This Encounter  Procedures  . DG Chest 2 View  . CBC with Differential/Platelet  . Comprehensive metabolic panel  . Lipid panel  . TSH   Meds ordered this encounter  Medications  . DISCONTD: Lisdexamfetamine Dimesylate (VYVANSE) 20 MG CHEW    Sig: Chew 1 tablet by mouth daily.    Dispense:  30 tablet      Refill:  0    . Reviewed expectations re: course of current medical issues. . Discussed self-management of symptoms. . Outlined signs and symptoms indicating need for more acute intervention. . Patient verbalized understanding and all questions were answered. Marland Kitchen Health Maintenance issues including appropriate healthy diet, exercise, and smoking avoidance were discussed with patient. . See orders for this visit as documented in the electronic medical record. . Patient received an After Visit Summary.  Helane Rima, DO [place], [facility] 03/16/2018

## 2018-03-05 NOTE — Patient Instructions (Signed)
Baker Cyst  A Baker cyst, also called a popliteal cyst, is a sac-like growth that forms at the back of the knee. The cyst forms when the fluid-filled sac (bursa) that cushions the knee joint becomes enlarged. The bursa that becomes a Baker cyst is located at the back of the knee joint.  What are the causes?  In most cases, a Baker cyst results from another knee problem that causes swelling inside the knee. This makes the fluid inside the knee joint (synovial fluid) flow into the bursa behind the knee, causing the bursa to enlarge.  What increases the risk?  You may be more likely to develop a Baker cyst if you already have a knee problem, such as:   A tear in cartilage that cushions the knee joint (meniscal tear).   A tear in the tissues that connect the bones of the knee joint (ligament tear).   Knee swelling from osteoarthritis, rheumatoid arthritis, or gout.  What are the signs or symptoms?  A Baker cyst does not always cause symptoms. A lump behind the knee may be the only sign of the condition. The lump may be painful, especially when the knee is straightened. If the lump is painful, the pain may come and go. The knee may also be stiff.  Symptoms may quickly get more severe if the cyst breaks open (ruptures). If your cyst ruptures, signs and symptoms may affect the knee and the back of the lower leg (calf) and may include:   Sudden or worsening pain.   Swelling.   Bruising.  How is this diagnosed?  This condition may be diagnosed based on your symptoms and medical history. Your health care provider will also do a physical exam. This may include:   Feeling the cyst to check whether it is tender.   Checking your knee for signs of another knee condition that causes swelling.  You may have imaging tests, such as:   X-rays.   MRI.   Ultrasound.  How is this treated?  A Baker cyst that is not painful may go away without treatment. If the cyst gets large or painful, it will likely get better if the  underlying knee problem is treated.  Treatment for a Baker cyst may include:   Resting.   Keeping weight off of the knee. This means not leaning on the knee to support your body weight.   NSAIDs to reduce pain and swelling.   A procedure to drain the fluid from the cyst with a needle (aspiration). You may also get an injection of a medicine that reduces swelling (steroid).   Surgery. This may be needed if other treatments do not work. This usually involves correcting knee damage and removing the cyst.  Follow these instructions at home:   Take over-the-counter and prescription medicines only as told by your health care provider.   Rest and return to your normal activities as told by your health care provider. Avoid activities that make pain or swelling worse. Ask your health care provider what activities are safe for you.   Keep all follow-up visits as told by your health care provider. This is important.  Contact a health care provider if:   You have knee pain, stiffness, or swelling that does not get better.  Get help right away if:   You have sudden or worsening pain and swelling in your calf area.  This information is not intended to replace advice given to you by your health care provider. Make   sure you discuss any questions you have with your health care provider.  Document Released: 01/02/2005 Document Revised: 09/23/2015 Document Reviewed: 09/23/2015  Elsevier Interactive Patient Education  2019 [employer]

## 2018-03-07 ENCOUNTER — Ambulatory Visit: Payer: 59 | Admitting: Family Medicine

## 2018-03-12 ENCOUNTER — Telehealth: Payer: Self-pay | Admitting: Family Medicine

## 2018-03-12 ENCOUNTER — Encounter: Payer: Self-pay | Admitting: Physician Assistant

## 2018-03-12 ENCOUNTER — Ambulatory Visit: Payer: 59 | Admitting: Physician Assistant

## 2018-03-12 VITALS — BP 90/60 | HR 82 | Temp 98.1°F | Ht 67.0 in | Wt 169.5 lb

## 2018-03-12 DIAGNOSIS — R05 Cough: Secondary | ICD-10-CM

## 2018-03-12 DIAGNOSIS — R059 Cough, unspecified: Secondary | ICD-10-CM

## 2018-03-12 LAB — POC INFLUENZA A&B (BINAX/QUICKVUE)
INFLUENZA B, POC: NEGATIVE
Influenza A, POC: NEGATIVE

## 2018-03-12 MED ORDER — PSEUDOEPH-BROMPHEN-DM 30-2-10 MG/5ML PO SYRP: 5.0000 mL | ORAL_SOLUTION | Freq: Four times a day (QID) | ORAL | 0 refills | Status: DC | PRN

## 2018-03-12 MED ORDER — FLUTICASONE PROPIONATE 50 MCG/ACT NA SUSP: 2.0000 | Freq: Every day | NASAL | 2 refills | Status: DC

## 2018-03-12 NOTE — Telephone Encounter (Signed)
Please advise 
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
## 2018-03-12 NOTE — Telephone Encounter (Signed)
Copied from CRM [id number]. Topic: Quick Communication - Rx Refill/Question >> Mar 11, 2018  3:54 PM Wyonia Hough E wrote: Medication: Lisdexamfetamine Dimesylate (VYVANSE) 20 MG CHEW - Pt can not afford the cost of this medication every month and wants to know if something can be used in place of this. Pt was on amphetamine-dextroamphetamine (ADDERALL XR) 20 MG 24 hr capsule, this worked but Pt had a side effect of grinding her teeth and doesn't know if she can see if a lower dose of the adderall would work. / please advise

## 2018-03-12 NOTE — Patient Instructions (Signed)
It was great to see you!  You have a viral upper respiratory infection. Antibiotics are not needed for this.  Viral infections usually take 7-10 days to resolve.  The cough can last a few weeks to go away.  Use medication as prescribed: bromfed cough syrup, flonase nasal spray, plain mucinex (store brand is fine)  Push fluids and get plenty of rest. Please return if you are not improving as expected, or if you have high fevers (>101.5) or difficulty swallowing or worsening productive cough.  Call clinic with questions.  I hope you start feeling better soon!

## 2018-03-13 MED ORDER — AMPHETAMINE-DEXTROAMPHET ER 10 MG PO CP24: 10.0000 mg | ORAL_CAPSULE | Freq: Every day | ORAL | 0 refills | Status: DC

## 2018-03-13 NOTE — Telephone Encounter (Signed)
Adderall 10 sent in for trial.

## 2018-03-13 NOTE — Telephone Encounter (Signed)
My chart message sent to let patient know.  

## 2018-03-14 ENCOUNTER — Encounter: Payer: Self-pay | Admitting: Family Medicine

## 2018-03-14 DIAGNOSIS — R7611 Nonspecific reaction to tuberculin skin test without active tuberculosis: Secondary | ICD-10-CM | POA: Insufficient documentation

## 2018-03-14 DIAGNOSIS — F902 Attention-deficit hyperactivity disorder, combined type: Secondary | ICD-10-CM | POA: Insufficient documentation

## 2018-03-14 DIAGNOSIS — M7121 Synovial cyst of popliteal space [Baker], right knee: Secondary | ICD-10-CM | POA: Insufficient documentation

## 2018-03-16 NOTE — Assessment & Plan Note (Signed)
Slightly worse. No real pain, except with lots of pressure from sitting. No redness. Discussed potential treatments to this benign issue. Will hold.

## 2018-03-16 NOTE — Assessment & Plan Note (Signed)
Okay to continue CXR for work.

## 2018-03-16 NOTE — Assessment & Plan Note (Signed)
Would li,e to trial another medication. Heard that Vyvanse works well. Will trial. Precautions reviewed. Prior authorization will be needed.

## 2018-05-30 NOTE — Progress Notes (Signed)
Virtual Visit via Video   Due to the COVID-19 pandemic, this visit was completed with telemedicine (audio/video) technology to reduce patient and provider exposure as well as to preserve personal protective equipment.   I connected with Daine Floras by a video enabled telemedicine application and verified that I am speaking with the correct person using two identifiers. Location patient: Home Location provider: [facility], Office Persons participating in the virtual visit: Keshonda, Leapley, DO   I discussed the limitations of evaluation and management by telemedicine and the availability of in person appointments. The patient expressed understanding and agreed to proceed.  Care Team   Patient Care Team: Helane Rima, DO as PCP - General (Family Medicine)  Subjective:   HPI: See Assessment and Plan section for Problem Based Charting of issues discussed today.  Review of Systems  Constitutional: Negative for chills, fever, malaise/fatigue and weight loss.  HENT: Positive for congestion.   Respiratory: Negative for cough, shortness of breath and wheezing.   Cardiovascular: Negative for chest pain, palpitations and leg swelling.  Gastrointestinal: Negative for abdominal pain, constipation, diarrhea, nausea and vomiting.  Genitourinary: Negative for dysuria and urgency.  Musculoskeletal: Positive for joint pain and myalgias.  Skin: Negative for rash.  Neurological: Negative for dizziness and headaches.  Psychiatric/Behavioral: Negative for depression, substance abuse and suicidal ideas. The patient is nervous/anxious.     Patient Active Problem List   Diagnosis Date Noted  . Attention deficit hyperactivity disorder (ADHD), combined type 03/14/2018  . Synovial cyst of right popliteal space 03/14/2018  . 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/2018  . Knee pain 01/29/2017  . 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  Adjustment reaction with anxiety and depression 01/20/2017  . Chronic cervical pain 01/20/2017  . Chronic lumbar pain 01/20/2017  . Arthritis 04/24/2016  . Migraines 04/24/2016    Social History   Tobacco Use  . Smoking status: Former Smoker    Packs/day: 1.00    Years: 10.00    Pack years: 10.00    Last attempt to quit: 05/18/1998    Years since quitting: 20.0  . Smokeless tobacco: Former Engineer, water Use Topics  . Alcohol use: Yes    Alcohol/week: 2.0 standard drinks    Types: 2 Glasses of wine per week    Comment: occassional   Current Outpatient Medications:  .  clonazePAM (KLONOPIN) 0.5 MG tablet, TAKE 1 TABLET BY MOUTH TID, Disp: 30 tablet, Rfl: 2 .  escitalopram (LEXAPRO) 20 MG tablet, Take 1 tablet (20 mg total) by mouth daily., Disp: 90 tablet, Rfl: 2 .  meloxicam (MOBIC) 15 MG tablet, TAKE 1 TABLET(15 MG) BY MOUTH DAILY, Disp: 90 tablet, Rfl: 1 .  Nutritional Supplements (ESTROVEN WEIGHT MANAGEMENT) CAPS, Take 1 capsule by mouth every other day., Disp: , Rfl:   No Known Allergies  Objective:   VITALS: Per patient if applicable, see vitals. GENERAL: Alert, appears well and in no acute distress. HEENT: Atraumatic, conjunctiva clear, no obvious abnormalities on inspection of external nose and ears. NECK: Normal movements of the head and neck. CARDIOPULMONARY: No increased WOB. Speaking in clear sentences. I:E ratio WNL.  MS: Moves all visible extremities without noticeable abnormality. PSYCH: Pleasant and cooperative, well-groomed. Speech normal rate and rhythm. Affect is appropriate. Insight and judgement are appropriate. Attention is focused, linear, and appropriate.  NEURO: CN grossly intact. Oriented as arrived to appointment on time with no prompting. Moves both UE  equally.  SKIN: No obvious lesions, wounds, erythema, or cyanosis noted on face or hands.  [facility]2018  Decreased Interest 0  Down, Depressed, Hopeless 1  PHQ - 2 Score 1    Altered sleeping 3  Tired, decreased energy 2  Change in appetite 2  Feeling bad or failure about yourself  1  Trouble concentrating 3  Moving slowly or fidgety/restless 1  Suicidal thoughts 0  PHQ-9 Score 13  Difficult doing work/chores Very difficult   Assessment and Plan:   Jasmine DecemberSharon was seen today for follow-up.  Diagnoses and all orders for this visit:  Attention deficit hyperactivity disorder (ADHD), combined type Comments: Did not know that medication sent in February. Will resend for trial.  Orders: -     amphetamine-dextroamphetamine (ADDERALL) 10 MG tablet; Take 1 tablet (10 mg total) by mouth 2 (two) times daily.  Synovial cyst of right popliteal space Comments: Describes worsening pain in posterior knee. Will ask SM to evaluate.  Adjustment reaction with anxiety and depression Comments: Controlled on current medication. Okay to refill medication today. Orders: -     clonazePAM (KLONOPIN) 0.5 MG tablet; TAKE 1 TABLET BY MOUTH TID -     escitalopram (LEXAPRO) 20 MG tablet; Take 1 tablet (20 mg total) by mouth daily.  Chronic pain of both knees Comments: Will ask Dr. Katrinka BlazingSmith to evaluate.   Orders: -     Ambulatory referral to Sports Medicine -     meloxicam (MOBIC) 15 MG tablet; TAKE 1 TABLET(15 MG) BY MOUTH DAILY  Obesity (BMI 30-39.9) Comments: Hiking for exercise and stress reduction.   Seasonal allergic rhinitis due to pollen Comments: Uncontrolled on antihistamine. Will add flonase and Singulair.  Orders: -     fluticasone (FLONASE) 50 MCG/ACT nasal spray; Place 2 sprays into both nostrils daily. -     montelukast (SINGULAIR) 10 MG tablet; Take 1 tablet (10 mg total) by mouth at bedtime.   Marland Kitchen. COVID-19 Education: The signs and symptoms of COVID-19 were discussed with the patient and how to seek care for testing if needed. The importance of social distancing was discussed today. . Reviewed expectations re: course of current medical issues. . Discussed  self-management of symptoms. . Outlined signs and symptoms indicating need for more acute intervention. . Patient verbalized understanding and all questions were answered. Marland Kitchen. Health Maintenance issues including appropriate healthy diet, exercise, and smoking avoidance were discussed with patient. . See orders for this visit as documented in the electronic medical record.  Helane RimaErica Ranbir Chew, DO  Records requested if needed. Time spent: 25 minutes, of which >50% was spent in obtaining information about her symptoms, reviewing her previous labs, evaluations, and treatments, counseling her about her condition (please see the discussed topics above), and developing a plan to further investigate it; she had a number of questions which I addressed.

## 2018-05-31 ENCOUNTER — Encounter: Payer: Self-pay | Admitting: Family Medicine

## 2018-05-31 ENCOUNTER — Other Ambulatory Visit: Payer: Self-pay

## 2018-05-31 ENCOUNTER — Ambulatory Visit (INDEPENDENT_AMBULATORY_CARE_PROVIDER_SITE_OTHER): Payer: 59 | Admitting: Family Medicine

## 2018-05-31 VITALS — Ht 67.0 in | Wt 169.0 lb

## 2018-05-31 DIAGNOSIS — M25562 Pain in left knee: Secondary | ICD-10-CM

## 2018-05-31 DIAGNOSIS — E669 Obesity, unspecified: Secondary | ICD-10-CM

## 2018-05-31 DIAGNOSIS — J301 Allergic rhinitis due to pollen: Secondary | ICD-10-CM

## 2018-05-31 DIAGNOSIS — F4323 Adjustment disorder with mixed anxiety and depressed mood: Secondary | ICD-10-CM

## 2018-05-31 DIAGNOSIS — M7121 Synovial cyst of popliteal space [Baker], right knee: Secondary | ICD-10-CM | POA: Diagnosis not present

## 2018-05-31 DIAGNOSIS — M25561 Pain in right knee: Secondary | ICD-10-CM | POA: Diagnosis not present

## 2018-05-31 DIAGNOSIS — F902 Attention-deficit hyperactivity disorder, combined type: Secondary | ICD-10-CM | POA: Diagnosis not present

## 2018-05-31 DIAGNOSIS — G8929 Other chronic pain: Secondary | ICD-10-CM

## 2018-05-31 MED ORDER — MELOXICAM 15 MG PO TABS: ORAL_TABLET | ORAL | 1 refills | Status: DC

## 2018-05-31 MED ORDER — ESCITALOPRAM OXALATE 20 MG PO TABS: 20.0000 mg | ORAL_TABLET | Freq: Every day | ORAL | 2 refills | Status: DC

## 2018-05-31 MED ORDER — CLONAZEPAM 0.5 MG PO TABS: ORAL_TABLET | ORAL | 2 refills | Status: DC

## 2018-05-31 MED ORDER — FLUTICASONE PROPIONATE 50 MCG/ACT NA SUSP: 2.0000 | Freq: Every day | NASAL | 2 refills | Status: DC

## 2018-05-31 MED ORDER — AMPHETAMINE-DEXTROAMPHETAMINE 10 MG PO TABS: 10.0000 mg | ORAL_TABLET | Freq: Two times a day (BID) | ORAL | 0 refills | Status: DC

## 2018-06-01 ENCOUNTER — Encounter: Payer: Self-pay | Admitting: Family Medicine

## 2018-06-01 MED ORDER — MONTELUKAST SODIUM 10 MG PO TABS: 10.0000 mg | ORAL_TABLET | Freq: Every day | ORAL | 3 refills | Status: DC

## 2018-06-03 ENCOUNTER — Telehealth: Payer: Self-pay

## 2018-06-03 DIAGNOSIS — F902 Attention-deficit hyperactivity disorder, combined type: Secondary | ICD-10-CM

## 2018-06-03 MED ORDER — AMPHETAMINE-DEXTROAMPHETAMINE 10 MG PO TABS: 10.0000 mg | ORAL_TABLET | Freq: Three times a day (TID) | ORAL | 0 refills | Status: DC

## 2018-06-03 NOTE — Addendum Note (Signed)
Addended by: Helane Rima R on: 06/03/2018 03:21 PM   Modules accepted: Orders

## 2018-06-03 NOTE — Telephone Encounter (Signed)
Fax recevied from pharmacy patient states that adderall should be for tid but we sent In for Bid if we need to change will need to send in script.

## 2018-06-08 ENCOUNTER — Telehealth: Payer: Self-pay

## 2018-06-08 NOTE — Telephone Encounter (Signed)
Blair [place]: Freddi Che - PA Case ID: [id number] - Rx #: [id number] Need help? Call us at [phone] Outcome Additional Information Required This medication or product is on your plan's list of covered drugs. Prior authorization is not required at this time. If your pharmacy has questions regarding the processing of your prescription, please have them call the [facility] pharmacy help desk at [phone]. **Please note: Formulary lowering, tiering exception, cost reduction and/or pre-benefit determination review (including prospective Medicare hospice reviews) requests cannot be requested using this method of submission. Please contact us at [phone] instead. Drug Amphetamine-Dextroamphetamine 10MG  tablets Form [facility] Electronic Prior Authorization Form (2017 NCPDP) Original Claim Info [id number]  Per web site no auth needed covered medications.

## 2018-09-01 NOTE — Progress Notes (Deleted)
Virtual Visit via Video   Due to the COVID-19 pandemic, this visit was completed with telemedicine (audio/video) technology to reduce patient and provider exposure as well as to preserve personal protective equipment.   I connected with Sandra Mccarthy by a video enabled telemedicine application and verified that I am speaking with the correct person using two identifiers. Location patient: Home Location provider: [facility], Office Persons participating in the virtual visit: Sandra Mccarthy, Deseray Daponte, DO   I discussed the limitations of evaluation and management by telemedicine and the availability of in person appointments. The patient expressed understanding and agreed to proceed.  Care Team   Patient Care Team: Helane RimaWallace, Zamier Eggebrecht, DO as PCP - General (Family Medicine)  Subjective:   HPI:   ROS   Patient Active Problem List   Diagnosis Date Noted  . Attention deficit hyperactivity disorder (ADHD), combined type 03/14/2018  . Synovial cyst of right popliteal space 03/14/2018  . 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/2018  . Knee pain 01/29/2017  . 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 Adjustment reaction with anxiety and depression 01/20/2017  . Chronic cervical pain 01/20/2017  . Chronic lumbar pain 01/20/2017  . Arthritis 04/24/2016  . Migraines 04/24/2016    Social History   Tobacco Use  . Smoking status: Former Smoker    Packs/day: 1.00    Years: 10.00    Pack years: 10.00    Quit date: 05/18/1998    Years since quitting: 20.3  . Smokeless tobacco: Former Engineer, waterUser  Substance Use Topics  . Alcohol use: Yes    Alcohol/week: 2.0 standard drinks    Types: 2 Glasses of wine per week    Comment: occassional    Current Outpatient Medications:  .  amphetamine-dextroamphetamine (ADDERALL) 10 MG tablet, Take 1 tablet (10 mg total) by mouth 3 (three) times daily., Disp: 90 tablet, Rfl: 0 .  clonazePAM (KLONOPIN) 0.5 MG  tablet, TAKE 1 TABLET BY MOUTH TID, Disp: 30 tablet, Rfl: 2 .  escitalopram (LEXAPRO) 20 MG tablet, Take 1 tablet (20 mg total) by mouth daily., Disp: 90 tablet, Rfl: 2 .  fluticasone (FLONASE) 50 MCG/ACT nasal spray, Place 2 sprays into both nostrils daily., Disp: 16 g, Rfl: 2 .  meloxicam (MOBIC) 15 MG tablet, TAKE 1 TABLET(15 MG) BY MOUTH DAILY, Disp: 90 tablet, Rfl: 1 .  montelukast (SINGULAIR) 10 MG tablet, Take 1 tablet (10 mg total) by mouth at bedtime., Disp: 30 tablet, Rfl: 3 .  Nutritional Supplements (ESTROVEN WEIGHT MANAGEMENT) CAPS, Take 1 capsule by mouth every other day., Disp: , Rfl:   No Known Allergies  Objective:   VITALS: Per patient if applicable, see vitals. GENERAL: Alert, appears well and in no acute distress. HEENT: Atraumatic, conjunctiva clear, no obvious abnormalities on inspection of external nose and ears. NECK: Normal movements of the head and neck. CARDIOPULMONARY: No increased WOB. Speaking in clear sentences. I:E ratio WNL.  MS: Moves all visible extremities without noticeable abnormality. PSYCH: Pleasant and cooperative, well-groomed. Speech normal rate and rhythm. Affect is appropriate. Insight and judgement are appropriate. Attention is focused, linear, and appropriate.  NEURO: CN grossly intact. Oriented as arrived to appointment on time with no prompting. Moves both UE equally.  SKIN: No obvious lesions, wounds, erythema, or cyanosis noted on face or hands.  Depression screen [facility] 2/[facility] 03/05/2018  Decreased Interest 0  Down, Depressed, Hopeless 1  PHQ - 2 Score 1  Altered sleeping 3  Tired, decreased energy 2  Change in appetite 2  Feeling bad or failure about yourself  1  Trouble concentrating 3  Moving slowly or fidgety/restless 1  Suicidal thoughts 0  PHQ-9 Score 13  Difficult doing work/chores Very difficult    Assessment and Plan:   There are no diagnoses linked to this encounter.  Marland Kitchen COVID-19 Education: The signs and symptoms of  COVID-19 were discussed with the patient and how to seek care for testing if needed. The importance of social distancing was discussed today. . Reviewed expectations re: course of current medical issues. . Discussed self-management of symptoms. . Outlined signs and symptoms indicating need for more acute intervention. . Patient verbalized understanding and all questions were answered. Marland Kitchen Health Maintenance issues including appropriate healthy diet, exercise, and smoking avoidance were discussed with patient. . See orders for this visit as documented in the electronic medical record.  Briscoe Deutscher, DO  Records requested if needed. Time spent: *** minutes, of which >50% was spent in obtaining information about her symptoms, reviewing her previous labs, evaluations, and treatments, counseling her about her condition (please see the discussed topics above), and developing a plan to further investigate it; she had a number of questions which I addressed.

## 2018-09-02 ENCOUNTER — Ambulatory Visit: Payer: 59 | Admitting: Family Medicine

## 2018-10-10 IMAGING — DX DG KNEE COMPLETE 4+V*L*
4 series · 4 of 4 positions shown · non-contrast
Comparison: Plain films left knee 02/06/2017. MRI left knee
08/01/2017.

CLINICAL DATA: The patient suffered a fall in May 2017 resulting
in a left fibular fracture. Continued pain and swelling.

EXAM:
LEFT KNEE - COMPLETE 4+ VIEW

[knee standing ap]
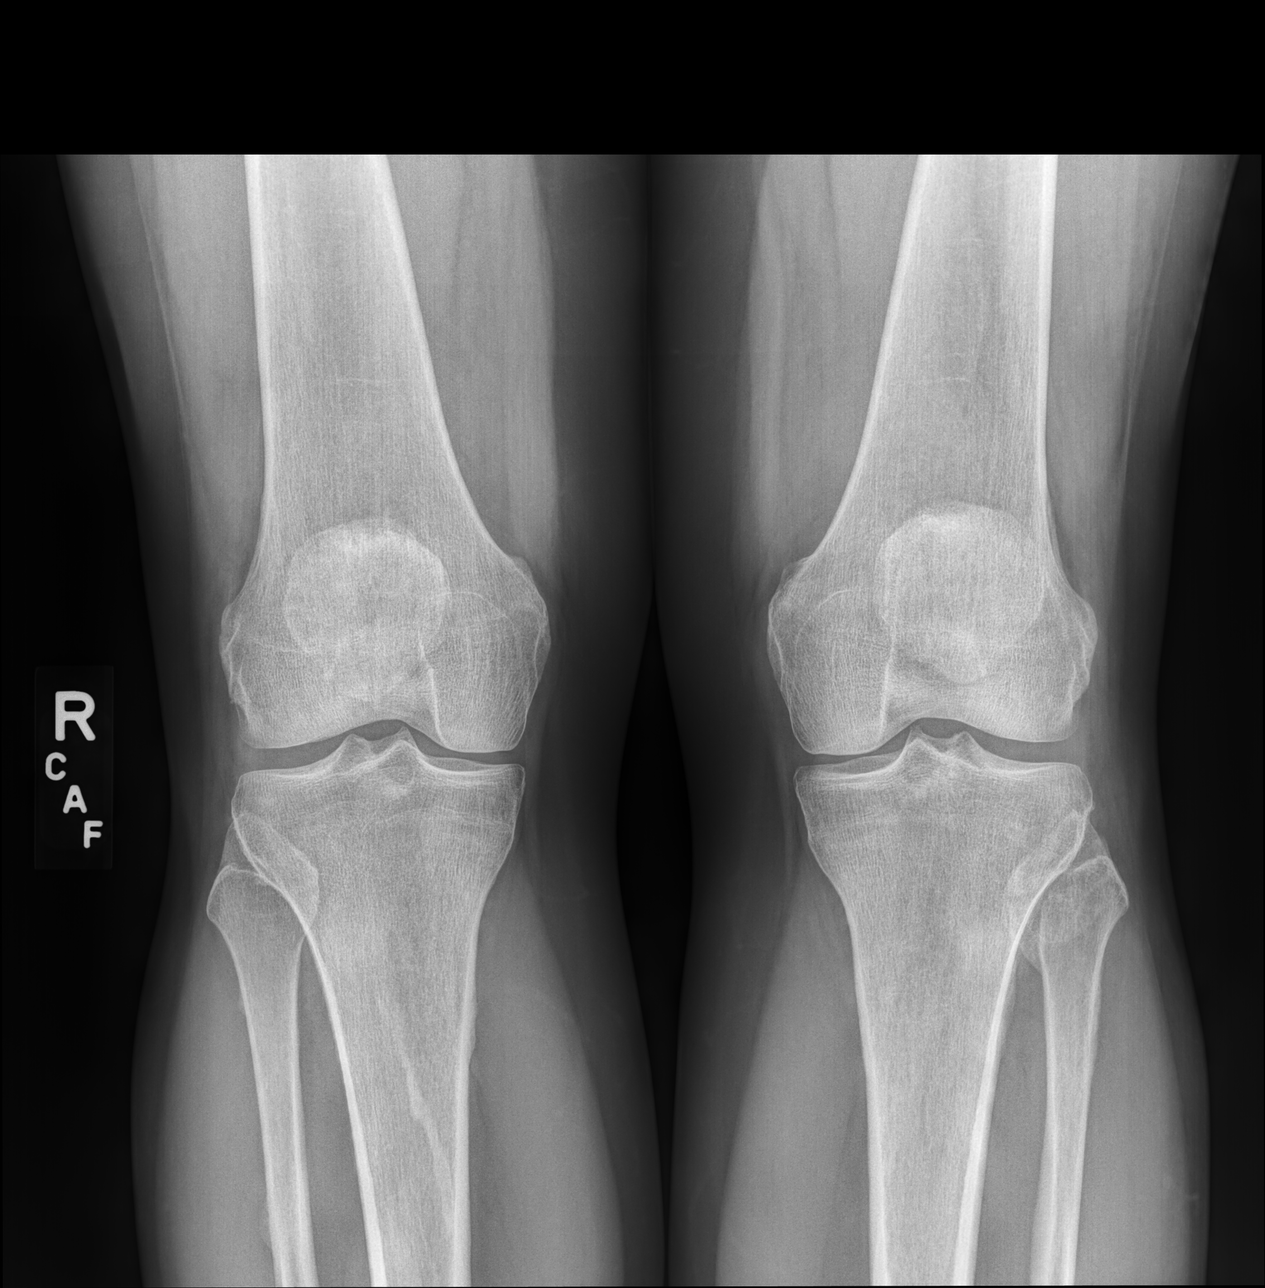

[knee standing lat]
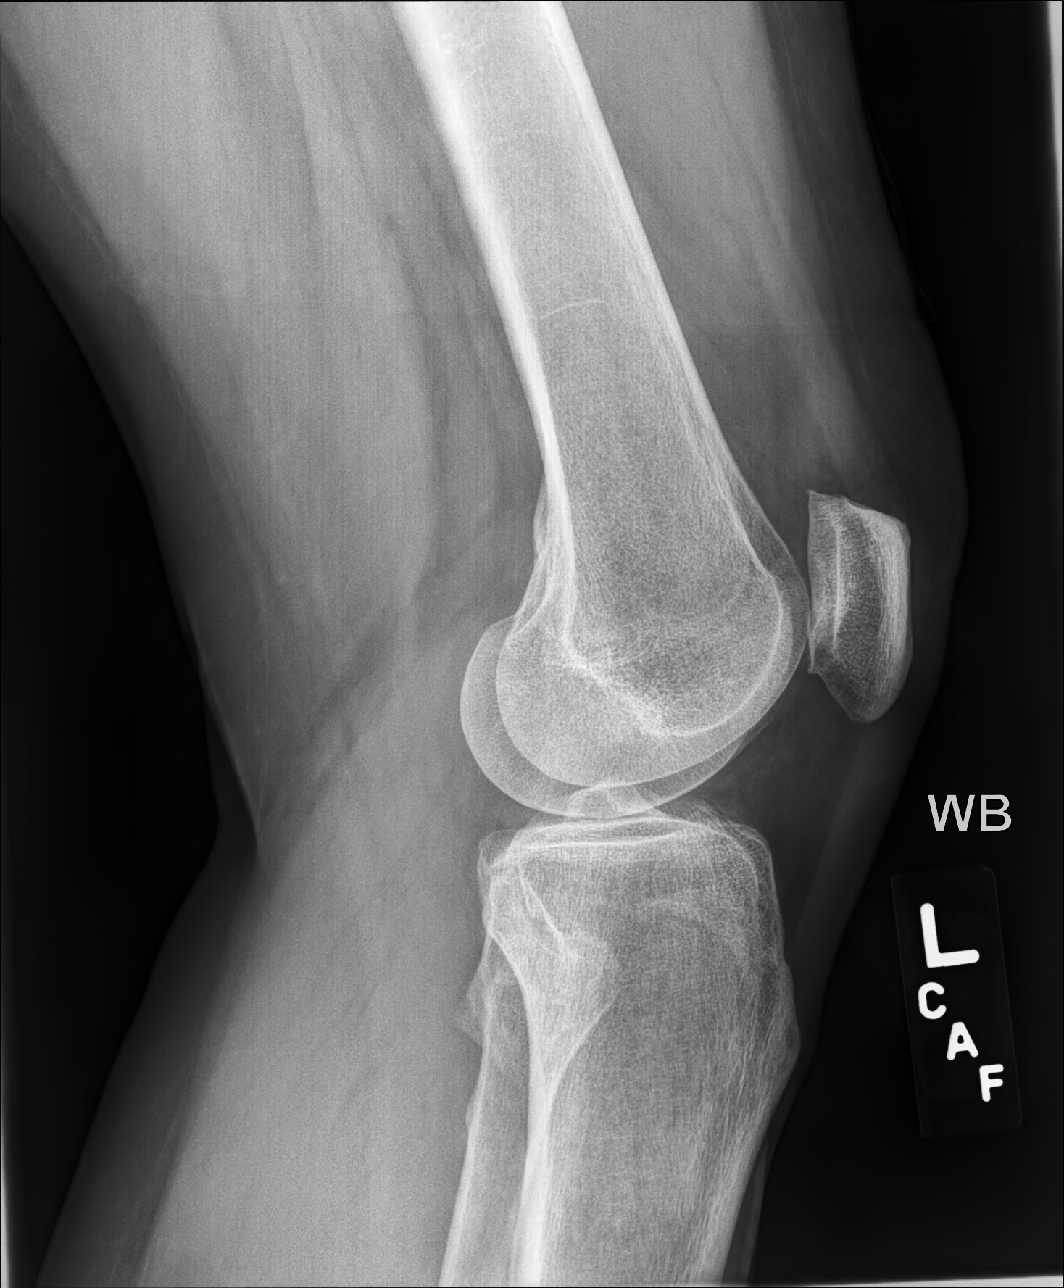

[knee [person_name] view pa]
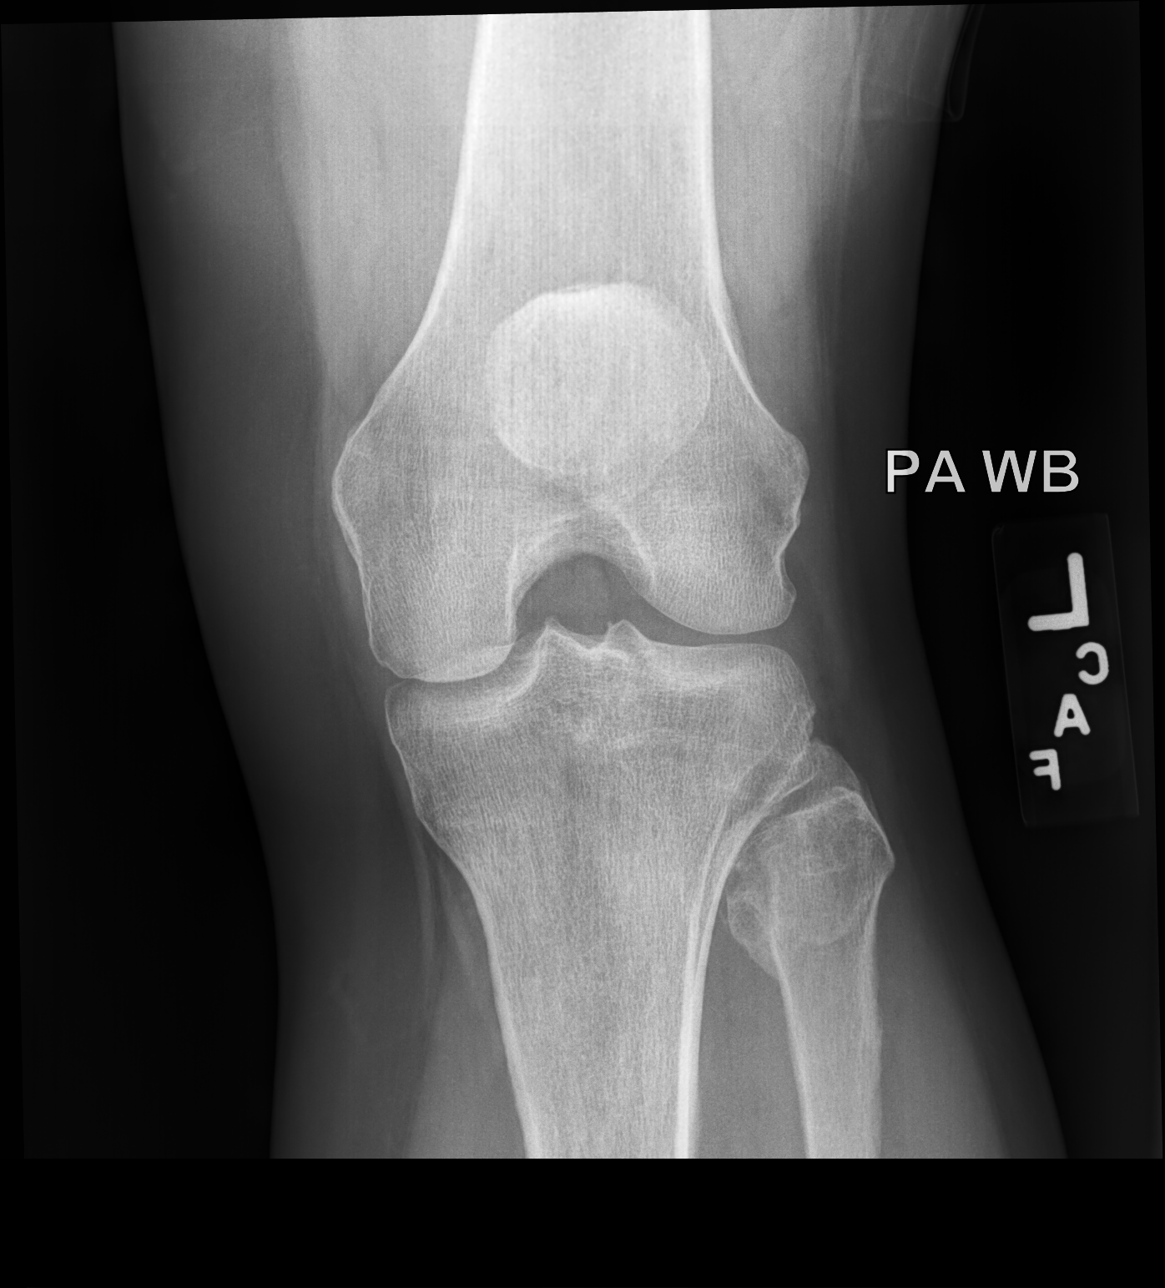

[sunrise]
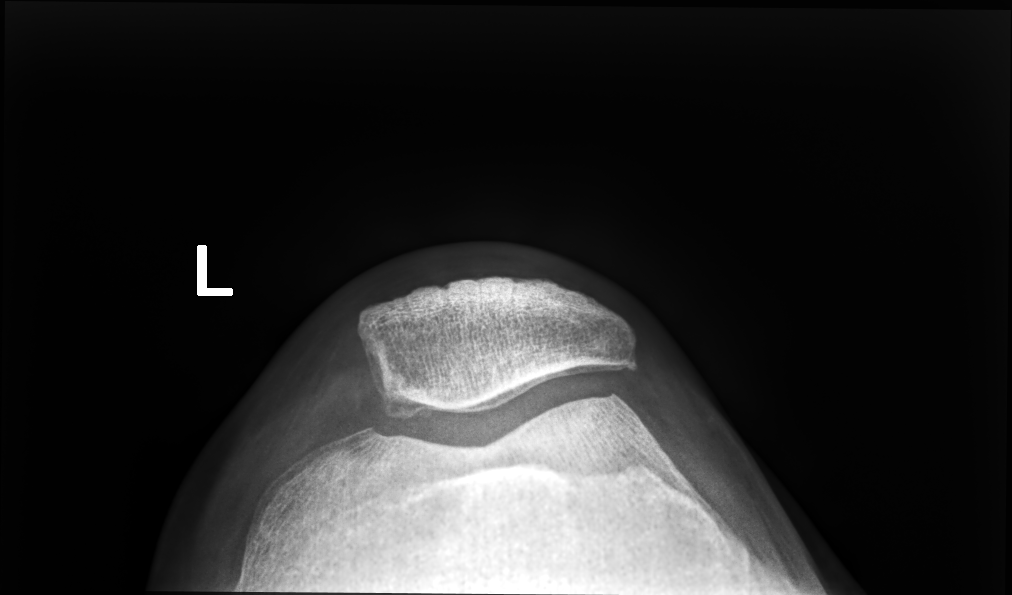

[4 of 4 positions shown; findings below may reference images not displayed]

FINDINGS: Standing AP view includes both knees. Joint spaces are preserved. No
acute abnormality is identified. Left fibular head fracture
demonstrates progressive healing. Fracture line is no longer
visible. No joint effusion.
IMPRESSION: Progressive healing of a left fibular head fracture. Fracture line
is no longer visible. No new abnormality.

## 2018-10-31 ENCOUNTER — Ambulatory Visit: Payer: 59 | Admitting: Physician Assistant

## 2018-10-31 ENCOUNTER — Encounter: Payer: Self-pay | Admitting: Physician Assistant

## 2018-10-31 ENCOUNTER — Other Ambulatory Visit: Payer: Self-pay

## 2018-10-31 VITALS — BP 100/80 | HR 68 | Temp 98.0°F | Ht 67.0 in | Wt 172.4 lb

## 2018-10-31 DIAGNOSIS — F4323 Adjustment disorder with mixed anxiety and depressed mood: Secondary | ICD-10-CM

## 2018-10-31 DIAGNOSIS — B029 Zoster without complications: Secondary | ICD-10-CM | POA: Diagnosis not present

## 2018-10-31 DIAGNOSIS — J301 Allergic rhinitis due to pollen: Secondary | ICD-10-CM

## 2018-10-31 DIAGNOSIS — F902 Attention-deficit hyperactivity disorder, combined type: Secondary | ICD-10-CM

## 2018-10-31 DIAGNOSIS — M25561 Pain in right knee: Secondary | ICD-10-CM

## 2018-10-31 DIAGNOSIS — M25562 Pain in left knee: Secondary | ICD-10-CM

## 2018-10-31 DIAGNOSIS — G8929 Other chronic pain: Secondary | ICD-10-CM

## 2018-10-31 MED ORDER — VALACYCLOVIR HCL 1 G PO TABS: 1000.0000 mg | ORAL_TABLET | Freq: Three times a day (TID) | ORAL | 0 refills | Status: DC

## 2018-10-31 MED ORDER — AMPHETAMINE-DEXTROAMPHET ER 20 MG PO CP24: 20.0000 mg | ORAL_CAPSULE | ORAL | 0 refills | Status: DC

## 2018-10-31 MED ORDER — MELOXICAM 15 MG PO TABS: ORAL_TABLET | ORAL | 1 refills | Status: DC

## 2018-10-31 MED ORDER — MONTELUKAST SODIUM 10 MG PO TABS: 10.0000 mg | ORAL_TABLET | Freq: Every day | ORAL | 3 refills | Status: DC

## 2018-10-31 MED ORDER — CLONAZEPAM 0.5 MG PO TABS: ORAL_TABLET | ORAL | 2 refills | Status: DC

## 2018-10-31 MED ORDER — ESCITALOPRAM OXALATE 20 MG PO TABS: 20.0000 mg | ORAL_TABLET | Freq: Every day | ORAL | 2 refills | Status: DC

## 2018-10-31 MED ORDER — HYDROXYZINE HCL 25 MG PO TABS: 25.0000 mg | ORAL_TABLET | Freq: Every evening | ORAL | 0 refills | Status: DC | PRN
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
## 2018-10-31 NOTE — Assessment & Plan Note (Signed)
Will trial extended release, Adderall 20 mg extended release daily, to help her be more compliant with once a day dosing.  Follow-up in 3 months, sooner if this regimen is not successful for her.

## 2018-10-31 NOTE — Assessment & Plan Note (Signed)
Overall feels well controlled.  She is taking Mobic as needed.  I have refilled this for her today.

## 2018-10-31 NOTE — Assessment & Plan Note (Addendum)
Well-controlled.  Denies any SI/HI.  Will resume Lexapro 20 mg and Klonopin as needed.  Follow-up in 3 months, sooner if needed.  PDMP reviewed, no concerns with her prescriptions.

## 2018-11-01 ENCOUNTER — Other Ambulatory Visit: Payer: Self-pay | Admitting: Physician Assistant

## 2018-11-01 MED ORDER — ACYCLOVIR 800 MG PO TABS: 800.0000 mg | ORAL_TABLET | Freq: Every day | ORAL | 0 refills | Status: AC

## 2018-12-03 ENCOUNTER — Other Ambulatory Visit: Payer: Self-pay | Admitting: Physician Assistant

## 2018-12-03 ENCOUNTER — Other Ambulatory Visit: Payer: Self-pay | Admitting: Family Medicine

## 2018-12-03 DIAGNOSIS — F4323 Adjustment disorder with mixed anxiety and depressed mood: Secondary | ICD-10-CM

## 2018-12-03 DIAGNOSIS — B029 Zoster without complications: Secondary | ICD-10-CM

## 2019-02-07 ENCOUNTER — Other Ambulatory Visit: Payer: Self-pay | Admitting: *Deleted

## 2019-02-07 DIAGNOSIS — J301 Allergic rhinitis due to pollen: Secondary | ICD-10-CM

## 2019-02-07 MED ORDER — FLUTICASONE PROPIONATE 50 MCG/ACT NA SUSP: 2.0000 | Freq: Every day | NASAL | 2 refills | Status: AC

## 2019-05-20 ENCOUNTER — Other Ambulatory Visit: Payer: Self-pay | Admitting: Family Medicine

## 2019-05-20 DIAGNOSIS — Z1231 Encounter for screening mammogram for malignant neoplasm of breast: Secondary | ICD-10-CM

## 2019-06-10 ENCOUNTER — Other Ambulatory Visit: Payer: Self-pay | Admitting: Physician Assistant

## 2019-06-10 DIAGNOSIS — J301 Allergic rhinitis due to pollen: Secondary | ICD-10-CM

## 2019-06-10 DIAGNOSIS — M25562 Pain in left knee: Secondary | ICD-10-CM

## 2019-06-20 ENCOUNTER — Other Ambulatory Visit: Payer: Self-pay

## 2019-06-20 ENCOUNTER — Ambulatory Visit
Admission: RE | Admit: 2019-06-20 | Discharge: 2019-06-20 | Disposition: A | Discharge status: Home / Self Care | Payer: 59 | Source: Ambulatory Visit | Attending: Family Medicine | Admitting: Family Medicine

## 2019-06-20 DIAGNOSIS — Z1231 Encounter for screening mammogram for malignant neoplasm of breast: Secondary | ICD-10-CM

## 2019-11-24 ENCOUNTER — Encounter: Payer: Self-pay | Admitting: Physician Assistant

## 2019-11-24 ENCOUNTER — Other Ambulatory Visit (HOSPITAL_COMMUNITY)
Admission: RE | Admit: 2019-11-24 | Discharge: 2019-11-24 | Disposition: A | Discharge status: Home / Self Care | Payer: 59 | Source: Ambulatory Visit | Attending: Physician Assistant | Admitting: Physician Assistant

## 2019-11-24 ENCOUNTER — Other Ambulatory Visit: Payer: Self-pay

## 2019-11-24 ENCOUNTER — Ambulatory Visit (INDEPENDENT_AMBULATORY_CARE_PROVIDER_SITE_OTHER): Payer: 59 | Admitting: Physician Assistant

## 2019-11-24 VITALS — BP 122/76 | HR 80 | Temp 97.9°F | Ht 67.0 in | Wt 170.5 lb

## 2019-11-24 DIAGNOSIS — G8929 Other chronic pain: Secondary | ICD-10-CM

## 2019-11-24 DIAGNOSIS — Z124 Encounter for screening for malignant neoplasm of cervix: Secondary | ICD-10-CM | POA: Insufficient documentation

## 2019-11-24 DIAGNOSIS — Z23 Encounter for immunization: Secondary | ICD-10-CM

## 2019-11-24 DIAGNOSIS — Z0001 Encounter for general adult medical examination with abnormal findings: Secondary | ICD-10-CM

## 2019-11-24 DIAGNOSIS — R0789 Other chest pain: Secondary | ICD-10-CM | POA: Diagnosis not present

## 2019-11-24 DIAGNOSIS — F902 Attention-deficit hyperactivity disorder, combined type: Secondary | ICD-10-CM

## 2019-11-24 DIAGNOSIS — Z13220 Encounter for screening for lipoid disorders: Secondary | ICD-10-CM

## 2019-11-24 DIAGNOSIS — M549 Dorsalgia, unspecified: Secondary | ICD-10-CM

## 2019-11-24 DIAGNOSIS — Z136 Encounter for screening for cardiovascular disorders: Secondary | ICD-10-CM

## 2019-11-24 DIAGNOSIS — F4323 Adjustment disorder with mixed anxiety and depressed mood: Secondary | ICD-10-CM | POA: Diagnosis not present

## 2019-11-24 DIAGNOSIS — E663 Overweight: Secondary | ICD-10-CM

## 2019-11-24 MED ORDER — LISDEXAMFETAMINE DIMESYLATE 20 MG PO CAPS
20.0000 mg | ORAL_CAPSULE | Freq: Every day | ORAL | 0 refills | Status: DC
Start: 2019-11-24 — End: 2021-03-24

## 2019-11-24 MED ORDER — HYDROXYZINE HCL 25 MG PO TABS: 25.0000 mg | ORAL_TABLET | Freq: Every evening | ORAL | 0 refills | Status: DC | PRN
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
## 2019-11-24 NOTE — Patient Instructions (Signed)
It was great to see you!  Please try to take your Lexapro consistently. Lets see if the atarax/hydroxyzine can help with sleep. Trial the Vyvanse to replace the Adderall - let me know if this helps you after a few weeks or so. You will get a call about your referral to sports medicine.  Please go to the lab for blood work.   Our office will call you with your results unless you have chosen to receive results via MyChart.  If your blood work is normal we will follow-up each year for physicals and as scheduled for chronic medical problems.  If anything is abnormal we will treat accordingly and get you in for a follow-up.  Take care,  [facility] Maintenance, Female Adopting a healthy lifestyle and getting preventive care are important in promoting health and wellness. Ask your health care provider about:  The right schedule for you to have regular tests and exams.  Things you can do on your own to prevent diseases and keep yourself healthy. What should I know about diet, weight, and exercise? Eat a healthy diet   Eat a diet that includes plenty of vegetables, fruits, low-fat dairy products, and lean protein.  Do not eat a lot of foods that are high in solid fats, added sugars, or sodium. Maintain a healthy weight Body mass index (BMI) is used to identify weight problems. It estimates body fat based on height and weight. Your health care provider can help determine your BMI and help you achieve or maintain a healthy weight. Get regular exercise Get regular exercise. This is one of the most important things you can do for your health. Most adults should:  Exercise for at least 150 minutes each week. The exercise should increase your heart rate and make you sweat (moderate-intensity exercise).  Do strengthening exercises at least twice a week. This is in addition to the moderate-intensity exercise.  Spend less time sitting. Even light physical activity can be  beneficial. Watch cholesterol and blood lipids Have your blood tested for lipids and cholesterol at 58 years of age, then have this test every 5 years. Have your cholesterol levels checked more often if:  Your lipid or cholesterol levels are high.  You are older than 58 years of age.  You are at high risk for heart disease. What should I know about cancer screening? Depending on your health history and family history, you may need to have cancer screening at various ages. This may include screening for:  Breast cancer.  Cervical cancer.  Colorectal cancer.  Skin cancer.  Lung cancer. What should I know about heart disease, diabetes, and high blood pressure? Blood pressure and heart disease  High blood pressure causes heart disease and increases the risk of stroke. This is more likely to develop in people who have high blood pressure readings, are of African descent, or are overweight.  Have your blood pressure checked: ? Every 3-5 years if you are 9-75 years of age. ? Every year if you are 37 years old or older. Diabetes Have regular diabetes screenings. This checks your fasting blood sugar level. Have the screening done:  Once every three years after age 41 if you are at a normal weight and have a low risk for diabetes.  More often and at a younger age if you are overweight or have a high risk for diabetes. What should I know about preventing infection? Hepatitis B If you have a higher risk for hepatitis  B, you should be screened for this virus. Talk with your health care provider to find out if you are at risk for hepatitis B infection. Hepatitis C Testing is recommended for:  Everyone born from [birth year] through 1964.  Anyone with known risk factors for hepatitis C. Sexually transmitted infections (STIs)  Get screened for STIs, including gonorrhea and chlamydia, if: ? You are sexually active and are younger than 58 years of age. ? You are older than 58 years of age and  your health care provider tells you that you are at risk for this type of infection. ? Your sexual activity has changed since you were last screened, and you are at increased risk for chlamydia or gonorrhea. Ask your health care provider if you are at risk.  Ask your health care provider about whether you are at high risk for HIV. Your health care provider may recommend a prescription medicine to help prevent HIV infection. If you choose to take medicine to prevent HIV, you should first get tested for HIV. You should then be tested every 3 months for as long as you are taking the medicine. Pregnancy  If you are about to stop having your period (premenopausal) and you may become pregnant, seek counseling before you get pregnant.  Take 400 to 800 micrograms (mcg) of folic acid every day if you become pregnant.  Ask for birth control (contraception) if you want to prevent pregnancy. Osteoporosis and menopause Osteoporosis is a disease in which the bones lose minerals and strength with aging. This can result in bone fractures. If you are 69 years old or older, or if you are at risk for osteoporosis and fractures, ask your health care provider if you should:  Be screened for bone loss.  Take a calcium or vitamin D supplement to lower your risk of fractures.  Be given hormone replacement therapy (HRT) to treat symptoms of menopause. Follow these instructions at home: Lifestyle  Do not use any products that contain nicotine or tobacco, such as cigarettes, e-cigarettes, and chewing tobacco. If you need help quitting, ask your health care provider.  Do not use street drugs.  Do not share needles.  Ask your health care provider for help if you need support or information about quitting drugs. Alcohol use  Do not drink alcohol if: ? Your health care provider tells you not to drink. ? You are pregnant, may be pregnant, or are planning to become pregnant.  If you drink alcohol: ? Limit how  much you use to 0-1 drink a day. ? Limit intake if you are breastfeeding.  Be aware of how much alcohol is in your drink. In the [place] one drink equals one 12 oz bottle of beer (355 mL), one 5 oz glass of wine (148 mL), or one 1 oz glass of hard liquor (44 mL). General instructions  Schedule regular health, dental, and eye exams.  Stay current with your vaccines.  Tell your health care provider if: ? You often feel depressed. ? You have ever been abused or do not feel safe at home. Summary  Adopting a healthy lifestyle and getting preventive care are important in promoting health and wellness.  Follow your health care provider's instructions about healthy diet, exercising, and getting tested or screened for diseases.  Follow your health care provider's instructions on monitoring your cholesterol and blood pressure. This information is not intended to replace advice given to you by your health care provider. Make sure you discuss any questions you  have with your health care provider. Document Revised: 12/26/2017 Document Reviewed: 12/26/2017 Elsevier Patient Education  2020 Elsevier Inc.  

## 2019-11-25 LAB — COMPREHENSIVE METABOLIC PANEL
AG Ratio: 1.6 (calc) (ref 1.0–2.5)
ALT: 21 U/L (ref 6–29)
AST: 22 U/L (ref 10–35)
Albumin: 4.1 g/dL (ref 3.6–5.1)
Alkaline phosphatase (APISO): 91 U/L (ref 37–153)
BUN: 19 mg/dL (ref 7–25)
CO2: 28 mmol/L (ref 20–32)
Calcium: 9.9 mg/dL (ref 8.6–10.4)
Chloride: 104 mmol/L (ref 98–110)
Creat: 0.67 mg/dL (ref 0.50–1.05)
Globulin: 2.6 g/dL (calc) (ref 1.9–3.7)
Glucose, Bld: 99 mg/dL (ref 65–99)
Potassium: 4.3 mmol/L (ref 3.5–5.3)
Sodium: 143 mmol/L (ref 135–146)
Total Bilirubin: 0.4 mg/dL (ref 0.2–1.2)
Total Protein: 6.7 g/dL (ref 6.1–8.1)

## 2019-11-25 LAB — LIPID PANEL
Cholesterol: 216 mg/dL — ABNORMAL HIGH (ref <200)
HDL: 55 mg/dL (ref >=50)
Non-HDL Cholesterol (Calc): 161 mg/dL (calc) — ABNORMAL HIGH (ref <130)
Total CHOL/HDL Ratio: 3.9 (calc) (ref <5.0)
Triglycerides: 407 mg/dL — ABNORMAL HIGH (ref <150)

## 2019-11-25 LAB — CBC WITH DIFFERENTIAL/PLATELET
Absolute Monocytes: 426 cells/uL (ref 200–950)
Basophils Absolute: 18 cells/uL (ref 0–200)
Basophils Relative: 0.3 %
Eosinophils Absolute: 72 cells/uL (ref 15–500)
Eosinophils Relative: 1.2 %
HCT: 36.8 % (ref 35.0–45.0)
Hemoglobin: 12.9 g/dL (ref 11.7–15.5)
Lymphs Abs: 2106 cells/uL (ref 850–3900)
MCH: 32.4 pg (ref 27.0–33.0)
MCHC: 35.1 g/dL (ref 32.0–36.0)
MCV: 92.5 fL (ref 80.0–100.0)
MPV: 10 fL (ref 7.5–12.5)
Monocytes Relative: 7.1 %
Neutro Abs: 3378 cells/uL (ref 1500–7800)
Neutrophils Relative %: 56.3 %
Platelets: 286 10*3/uL (ref 140–400)
RBC: 3.98 10*6/uL (ref 3.80–5.10)
RDW: 12.3 % (ref 11.0–15.0)
Total Lymphocyte: 35.1 %
WBC: 6 10*3/uL (ref 3.8–10.8)

## 2019-11-25 LAB — TSH: TSH: 1.27 mIU/L (ref 0.40–4.50)

## 2019-11-26 ENCOUNTER — Encounter: Payer: Self-pay | Admitting: Physician Assistant

## 2019-11-26 LAB — CYTOLOGY - PAP
Comment: NEGATIVE
Diagnosis: NEGATIVE
High risk HPV: NEGATIVE
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
## 2019-11-27 ENCOUNTER — Ambulatory Visit: Payer: 59 | Admitting: Family Medicine

## 2019-11-27 ENCOUNTER — Other Ambulatory Visit: Payer: Self-pay | Admitting: Physician Assistant

## 2019-11-27 DIAGNOSIS — R0789 Other chest pain: Secondary | ICD-10-CM

## 2019-11-27 DIAGNOSIS — E785 Hyperlipidemia, unspecified: Secondary | ICD-10-CM

## 2019-12-15 ENCOUNTER — Ambulatory Visit: Payer: 59 | Admitting: Family Medicine
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
## 2020-01-12 ENCOUNTER — Ambulatory Visit: Payer: 59 | Admitting: Physician Assistant

## 2020-01-12 ENCOUNTER — Encounter: Payer: Self-pay | Admitting: Physician Assistant

## 2020-01-12 ENCOUNTER — Telehealth: Payer: 59 | Admitting: Physician Assistant

## 2020-01-12 ENCOUNTER — Other Ambulatory Visit: Payer: Self-pay | Admitting: *Deleted

## 2020-01-12 ENCOUNTER — Other Ambulatory Visit: Payer: Self-pay

## 2020-01-12 ENCOUNTER — Other Ambulatory Visit: Payer: 59

## 2020-01-12 VITALS — Ht 67.0 in | Wt 165.0 lb

## 2020-01-12 DIAGNOSIS — Z20822 Contact with and (suspected) exposure to covid-19: Secondary | ICD-10-CM

## 2020-01-12 DIAGNOSIS — E785 Hyperlipidemia, unspecified: Secondary | ICD-10-CM | POA: Diagnosis not present

## 2020-01-12 DIAGNOSIS — Z202 Contact with and (suspected) exposure to infections with a predominantly sexual mode of transmission: Secondary | ICD-10-CM
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
## 2020-01-12 NOTE — Progress Notes (Signed)
Virtual Visit via Video   I connected with Sandra Mccarthy on 01/12/20 at 11:30 AM EST by a video enabled telemedicine application and verified that I am speaking with the correct person using two identifiers. Location patient: Home Location provider: [facility], Office Persons participating in the virtual visit: Sandra Mccarthy, [name] PA-C,Sandra Lennon Alstrom, LPN   I discussed the limitations of evaluation and management by telemedicine and the availability of in person appointments. The patient expressed understanding and agreed to proceed.  I acted as a Neurosurgeon for [employer], PA-C [employer], LPN   Subjective:   HPI:   STD testing Pt is here STD testing. She went to the urgent care on 01/08/20 and tested positive for gonorrhea. She states that her husband was positive and recently informed her that he cheated on her. She was given a rocephin injection and also started on oral doxycycline. She has history of oral cold sores but no other evidence/hx of STDs. She is tolerating oral doxycycline without any issues at this time.   HLD She is due for repeat lipid panel testing. She is currently not on any medications.   COVID exposure Pt was exposed to COVID on 12/25. She visited her daughter to drop off Christmas presents at her house. Her daughter's boyfriend and child tested positive. Her daughter is awaiting her test results. Patient has not been tested yet and does not have any symptoms.   ROS: See pertinent positives and negatives per HPI.  Patient Active Problem List   Diagnosis Date Noted  . Attention deficit hyperactivity disorder (ADHD), combined type 03/14/2018  . Synovial cyst of right popliteal space 03/14/2018  . 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/2018  . Knee pain 01/29/2017  . 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 Adjustment reaction with anxiety and depression 01/20/2017  . Chronic cervical  pain 01/20/2017  . Chronic lumbar pain 01/20/2017  . Arthritis 04/24/2016  . Migraines 04/24/2016    Social History   Tobacco Use  . Smoking status: Former Smoker    Packs/day: 1.00    Years: 10.00    Pack years: 10.00    Quit date: 05/18/1998    Years since quitting: 21.6  . Smokeless tobacco: Former Engineer, water Use Topics  . Alcohol use: Yes    Alcohol/week: 2.0 standard drinks    Types: 2 Glasses of wine per week    Comment: occassional    Current Outpatient Medications:  .  clonazePAM (KLONOPIN) 0.5 MG tablet, TAKE 1 TABLET BY MOUTH TID (Patient taking differently: Take 0.5 mg by mouth 3 (three) times daily as needed. TAKE 1 TABLET BY MOUTH TID), Disp: 30 tablet, Rfl: 2 .  doxycycline (VIBRA-TABS) 100 MG tablet, Take 100 mg by mouth 2 (two) times daily., Disp: , Rfl:  .  escitalopram (LEXAPRO) 20 MG tablet, TAKE 1 TABLET(20 MG) BY MOUTH DAILY, Disp: 30 tablet, Rfl: 0 .  fluticasone (FLONASE) 50 MCG/ACT nasal spray, Place 2 sprays into both nostrils daily., Disp: 16 g, Rfl: 2 .  hydrOXYzine (ATARAX/VISTARIL) 25 MG tablet, Take 1 tablet (25 mg total) by mouth at bedtime as needed for anxiety (insomnia). Take one 25 mg tablet 30-60 minutes prior to bedtime for insomnia, anxiety. May increase to two tablets., Disp: 60 tablet, Rfl: 0 .  lisdexamfetamine (VYVANSE) 20 MG capsule, Take 1 capsule (20 mg total) by mouth daily., Disp: 30 capsule, Rfl: 0 .  meloxicam (MOBIC) 15 MG  tablet, TAKE 1 TABLET(15 MG) BY MOUTH DAILY, Disp: 90 tablet, Rfl: 1  Allergies  Allergen Reactions  . Prednisone Other (See Comments)    Hallucinations    Objective:   VITALS: Per patient if applicable, see vitals. GENERAL: Alert, appears well and in no acute distress. HEENT: Atraumatic, conjunctiva clear, no obvious abnormalities on inspection of external nose and ears. NECK: Normal movements of the head and neck. CARDIOPULMONARY: No increased WOB. Speaking in clear sentences. I:E ratio WNL.  MS:  Moves all visible extremities without noticeable abnormality. PSYCH: Pleasant and cooperative, well-groomed. Speech normal rate and rhythm. Affect is appropriate. Insight and judgement are appropriate. Attention is focused, linear, and appropriate.  NEURO: CN grossly intact. Oriented as arrived to appointment on time with no prompting. Moves both UE equally.  SKIN: No obvious lesions, wounds, erythema, or cyanosis noted on face or hands.  Assessment and Plan:   Junetta was seen today for positive for gonorrhea and std testing.  Diagnoses and all orders for this visit:  STD exposure Continue oral doxycycline. Lab tests ordered as below, will plan for further treatment based on results of test. -     HIV Antibody (routine testing w rflx); Future -     RPR; Future -     Hepatitis C Antibody; Future  Hyperlipidemia, unspecified hyperlipidemia type Scheduled blood work for Thursday, will await negative COVID test results until able to come into the office for lab work. -     Lipid panel; Future  Close exposure to COVID-19 virus No red flags on discussion, patient is not in any obvious distress during our visit. Asymptomatic at this time, she is scheduled to come in for testing this afternoon. Recommended need to self-quarantine and practice social distancing until results return. I recommend that patient follow-up if symptoms worsen or persist despite treatment x 7-10 days, sooner if needed.  Other orders -     Cancel: Urine cytology ancillary only  I discussed the assessment and treatment plan with the patient. The patient was provided an opportunity to ask questions and all were answered. The patient agreed with the plan and demonstrated an understanding of the instructions.   The patient was advised to call back or seek an in-person evaluation if the symptoms worsen or if the condition fails to improve as anticipated.   CMA or LPN served as scribe during this visit. History, Physical,  and Plan performed by medical provider. The above documentation has been reviewed and is accurate and complete.   [place], [place] 01/12/2020

## 2020-01-13 LAB — SARS-COV-2, NAA 2 DAY TAT

## 2020-01-13 LAB — NOVEL CORONAVIRUS, NAA: SARS-CoV-2, NAA: NOT DETECTED

## 2020-01-15 ENCOUNTER — Encounter: Payer: Self-pay | Admitting: Physician Assistant

## 2020-01-15 ENCOUNTER — Other Ambulatory Visit: Payer: Self-pay

## 2020-01-15 ENCOUNTER — Other Ambulatory Visit (INDEPENDENT_AMBULATORY_CARE_PROVIDER_SITE_OTHER): Payer: 59

## 2020-01-15 DIAGNOSIS — E785 Hyperlipidemia, unspecified: Secondary | ICD-10-CM

## 2020-01-15 DIAGNOSIS — Z202 Contact with and (suspected) exposure to infections with a predominantly sexual mode of transmission: Secondary | ICD-10-CM

## 2020-01-15 LAB — LDL CHOLESTEROL, DIRECT: Direct LDL: 82 mg/dL

## 2020-01-15 LAB — LIPID PANEL
Cholesterol: 207 mg/dL — ABNORMAL HIGH (ref 0–200)
HDL: 50.1 mg/dL (ref >39.00)
NonHDL: 157.39
Total CHOL/HDL Ratio: 4
Triglycerides: 312 mg/dL — ABNORMAL HIGH (ref 0.0–149.0)
VLDL: 62.4 mg/dL — ABNORMAL HIGH (ref 0.0–40.0)

## 2020-01-19 LAB — HIV ANTIBODY (ROUTINE TESTING W REFLEX): HIV 1&2 Ab, 4th Generation: NONREACTIVE

## 2020-01-19 LAB — HEPATITIS C ANTIBODY
Hepatitis C Ab: NONREACTIVE
SIGNAL TO CUT-OFF: 0.01 (ref <1.00)

## 2020-01-19 LAB — RPR: RPR Ser Ql: NONREACTIVE

## 2020-09-03 ENCOUNTER — Encounter: Payer: Self-pay | Admitting: Physician Assistant

## 2020-09-03 ENCOUNTER — Other Ambulatory Visit: Payer: Self-pay

## 2020-09-03 ENCOUNTER — Ambulatory Visit: Payer: 59 | Admitting: Physician Assistant

## 2020-09-03 VITALS — BP 106/71 | HR 84 | Temp 98.4°F | Ht 67.0 in | Wt 172.4 lb

## 2020-09-03 DIAGNOSIS — R079 Chest pain, unspecified: Secondary | ICD-10-CM

## 2020-09-03 DIAGNOSIS — N390 Urinary tract infection, site not specified: Secondary | ICD-10-CM | POA: Diagnosis not present

## 2020-09-03 DIAGNOSIS — R5383 Other fatigue: Secondary | ICD-10-CM

## 2020-09-03 DIAGNOSIS — R21 Rash and other nonspecific skin eruption: Secondary | ICD-10-CM

## 2020-09-03 DIAGNOSIS — R030 Elevated blood-pressure reading, without diagnosis of hypertension: Secondary | ICD-10-CM

## 2020-09-03 DIAGNOSIS — R0602 Shortness of breath: Secondary | ICD-10-CM

## 2020-09-03 DIAGNOSIS — K625 Hemorrhage of anus and rectum: Secondary | ICD-10-CM

## 2020-09-03 DIAGNOSIS — H6983 Other specified disorders of Eustachian tube, bilateral: Secondary | ICD-10-CM

## 2020-09-03 DIAGNOSIS — Z1283 Encounter for screening for malignant neoplasm of skin: Secondary | ICD-10-CM

## 2020-09-03 DIAGNOSIS — E785 Hyperlipidemia, unspecified: Secondary | ICD-10-CM

## 2020-09-03 DIAGNOSIS — N907 Vulvar cyst: Secondary | ICD-10-CM

## 2020-09-03 LAB — POCT URINALYSIS DIPSTICK
Bilirubin, UA: NEGATIVE
Blood, UA: POSITIVE
Glucose, UA: NEGATIVE
Ketones, UA: NEGATIVE
Nitrite, UA: NEGATIVE
Protein, UA: POSITIVE — AB
Spec Grav, UA: 1.01 (ref 1.010–1.025)
Urobilinogen, UA: 0.2 E.U./dL
pH, UA: 7 (ref 5.0–8.0)

## 2020-09-03 NOTE — Patient Instructions (Signed)
It was great to see you!  Referrals placed today for:  Urology, Cardiology, Gastroenterology, Dermatology  Update blood work and urine test today for fatigue  Trial an OTC decongestant such as sudafed for your ear congestion -- review handout on eustachian tube dysfunction
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
## 2020-09-04 LAB — COMPREHENSIVE METABOLIC PANEL
AG Ratio: 1.7 (calc) (ref 1.0–2.5)
ALT: 31 U/L — ABNORMAL HIGH (ref 6–29)
AST: 30 U/L (ref 10–35)
Albumin: 4.5 g/dL (ref 3.6–5.1)
Alkaline phosphatase (APISO): 100 U/L (ref 37–153)
BUN: 16 mg/dL (ref 7–25)
CO2: 33 mmol/L — ABNORMAL HIGH (ref 20–32)
Calcium: 10 mg/dL (ref 8.6–10.4)
Chloride: 102 mmol/L (ref 98–110)
Creat: 0.77 mg/dL (ref 0.50–1.03)
Globulin: 2.7 g/dL (calc) (ref 1.9–3.7)
Glucose, Bld: 100 mg/dL — ABNORMAL HIGH (ref 65–99)
Potassium: 4.3 mmol/L (ref 3.5–5.3)
Sodium: 141 mmol/L (ref 135–146)
Total Bilirubin: 0.4 mg/dL (ref 0.2–1.2)
Total Protein: 7.2 g/dL (ref 6.1–8.1)

## 2020-09-04 LAB — IRON,TIBC AND FERRITIN PANEL
%SAT: 36 % (calc) (ref 16–45)
Ferritin: 221 ng/mL (ref 16–232)
Iron: 120 ug/dL (ref 45–160)
TIBC: 338 mcg/dL (calc) (ref 250–450)

## 2020-09-04 LAB — URINE CULTURE
MICRO NUMBER:: 12266665
SPECIMEN QUALITY:: ADEQUATE

## 2020-09-04 LAB — CBC WITH DIFFERENTIAL/PLATELET
Absolute Monocytes: 441 cells/uL (ref 200–950)
Basophils Absolute: 23 cells/uL (ref 0–200)
Basophils Relative: 0.3 %
Eosinophils Absolute: 129 cells/uL (ref 15–500)
Eosinophils Relative: 1.7 %
HCT: 40.7 % (ref 35.0–45.0)
Hemoglobin: 13.5 g/dL (ref 11.7–15.5)
Lymphs Abs: 2409 cells/uL (ref 850–3900)
MCH: 31.1 pg (ref 27.0–33.0)
MCHC: 33.2 g/dL (ref 32.0–36.0)
MCV: 93.8 fL (ref 80.0–100.0)
MPV: 9.5 fL (ref 7.5–12.5)
Monocytes Relative: 5.8 %
Neutro Abs: 4598 cells/uL (ref 1500–7800)
Neutrophils Relative %: 60.5 %
Platelets: 320 10*3/uL (ref 140–400)
RBC: 4.34 10*6/uL (ref 3.80–5.10)
RDW: 12.2 % (ref 11.0–15.0)
Total Lymphocyte: 31.7 %
WBC: 7.6 10*3/uL (ref 3.8–10.8)

## 2020-09-04 LAB — LIPID PANEL
Cholesterol: 223 mg/dL — ABNORMAL HIGH (ref <200)
HDL: 48 mg/dL — ABNORMAL LOW (ref >=50)
Non-HDL Cholesterol (Calc): 175 mg/dL (calc) — ABNORMAL HIGH (ref <130)
Total CHOL/HDL Ratio: 4.6 (calc) (ref <5.0)
Triglycerides: 478 mg/dL — ABNORMAL HIGH (ref <150)

## 2020-09-04 LAB — TSH: TSH: 1.33 mIU/L (ref 0.40–4.50)

## 2020-09-04 LAB — VITAMIN B12: Vitamin B-12: 487 pg/mL (ref 200–1100)

## 2020-10-26 ENCOUNTER — Ambulatory Visit: Payer: 59 | Admitting: Internal Medicine

## 2020-10-26 ENCOUNTER — Encounter: Payer: Self-pay | Admitting: Internal Medicine

## 2020-10-26 ENCOUNTER — Other Ambulatory Visit: Payer: Self-pay

## 2020-10-26 VITALS — BP 110/80 | HR 76 | Ht 67.0 in | Wt 177.4 lb

## 2020-10-26 DIAGNOSIS — R079 Chest pain, unspecified: Secondary | ICD-10-CM
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
## 2020-10-26 NOTE — Patient Instructions (Signed)
Medication Instructions:  NO CHANGES  *If you need a refill on your cardiac medications before your next appointment, please call your pharmacy*   Follow-Up: At [facility], you and your health needs are our priority.  As part of our continuing mission to provide you with exceptional heart care, we have created designated Provider Care Teams.  These Care Teams include your primary Cardiologist (physician) and Advanced Practice Providers (APPs -  Physician Assistants and Nurse Practitioners) who all work together to provide you with the care you need, when you need it.  We recommend signing up for the patient portal called "MyChart".  Sign up information is provided on this After Visit Summary.  MyChart is used to connect with patients for Virtual Visits (Telemedicine).  Patients are able to view lab/test results, encounter notes, upcoming appointments, etc.  Non-urgent messages can be sent to your provider as well.   To learn more about what you can do with MyChart, go to [email or web address].    Your next appointment:   AS NEEDED with Dr. Carolan Clines

## 2021-01-12 ENCOUNTER — Encounter: Payer: Self-pay | Admitting: Physician Assistant

## 2021-02-17 ENCOUNTER — Ambulatory Visit (INDEPENDENT_AMBULATORY_CARE_PROVIDER_SITE_OTHER): Payer: 59 | Admitting: Plastic Surgery

## 2021-02-17 ENCOUNTER — Other Ambulatory Visit: Payer: Self-pay

## 2021-02-17 VITALS — BP 130/84 | HR 74 | Ht 67.0 in | Wt 179.2 lb

## 2021-02-17 DIAGNOSIS — H02831 Dermatochalasis of right upper eyelid: Secondary | ICD-10-CM

## 2021-02-17 DIAGNOSIS — Z411 Encounter for cosmetic surgery: Secondary | ICD-10-CM

## 2021-02-17 DIAGNOSIS — H02834 Dermatochalasis of left upper eyelid: Secondary | ICD-10-CM | POA: Diagnosis not present
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
## 2021-03-15 ENCOUNTER — Ambulatory Visit: Payer: 59 | Admitting: Dermatology

## 2021-03-24 ENCOUNTER — Ambulatory Visit: Payer: 59 | Admitting: Family Medicine

## 2021-03-24 ENCOUNTER — Encounter: Payer: Self-pay | Admitting: Family Medicine

## 2021-03-24 VITALS — BP 108/70 | HR 65 | Temp 98.0°F | Ht 67.0 in | Wt 176.0 lb

## 2021-03-24 DIAGNOSIS — J069 Acute upper respiratory infection, unspecified: Secondary | ICD-10-CM
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
## 2021-03-24 NOTE — Patient Instructions (Signed)
°  You can take tylenol for pain/fevers °If worsening symptoms, let us know or go to the Emergency room  °

## 2021-04-11 ENCOUNTER — Ambulatory Visit (INDEPENDENT_AMBULATORY_CARE_PROVIDER_SITE_OTHER): Payer: 59 | Admitting: Physician Assistant

## 2021-04-11 ENCOUNTER — Encounter: Payer: Self-pay | Admitting: Physician Assistant

## 2021-04-11 VITALS — BP 105/71 | HR 80 | Temp 98.7°F | Ht 67.0 in | Wt 176.2 lb

## 2021-04-11 DIAGNOSIS — Z0001 Encounter for general adult medical examination with abnormal findings: Secondary | ICD-10-CM | POA: Diagnosis not present

## 2021-04-11 DIAGNOSIS — E663 Overweight: Secondary | ICD-10-CM | POA: Diagnosis not present

## 2021-04-11 DIAGNOSIS — F4323 Adjustment disorder with mixed anxiety and depressed mood: Secondary | ICD-10-CM

## 2021-04-11 DIAGNOSIS — E559 Vitamin D deficiency, unspecified: Secondary | ICD-10-CM

## 2021-04-11 DIAGNOSIS — G8929 Other chronic pain: Secondary | ICD-10-CM

## 2021-04-11 DIAGNOSIS — E785 Hyperlipidemia, unspecified: Secondary | ICD-10-CM

## 2021-04-11 DIAGNOSIS — R413 Other amnesia: Secondary | ICD-10-CM

## 2021-04-11 DIAGNOSIS — M25562 Pain in left knee: Secondary | ICD-10-CM

## 2021-04-11 DIAGNOSIS — F902 Attention-deficit hyperactivity disorder, combined type: Secondary | ICD-10-CM

## 2021-04-11 DIAGNOSIS — J301 Allergic rhinitis due to pollen: Secondary | ICD-10-CM

## 2021-04-11 DIAGNOSIS — R5383 Other fatigue: Secondary | ICD-10-CM

## 2021-04-11 DIAGNOSIS — M25561 Pain in right knee: Secondary | ICD-10-CM

## 2021-04-11 MED ORDER — MELOXICAM 15 MG PO TABS: ORAL_TABLET | ORAL | 0 refills | Status: DC
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
## 2021-04-11 NOTE — Patient Instructions (Addendum)
It was great to see you! ? ?Please make an appointment with the lab on your way out. I would like for you to return for lab work within 1-2 weeks. After midnight on the day of the lab draw, please do not eat anything. You may have water, black coffee, unsweetened tea. ? ?I will put in a neurocognitive evaluation -- you will be contacted about this ? ?Please get your mammogram ? ?Take care, ? ?Lelon Mast ?  ?

## 2021-04-12 ENCOUNTER — Other Ambulatory Visit: Payer: Self-pay | Admitting: Physician Assistant

## 2021-04-12 ENCOUNTER — Other Ambulatory Visit (INDEPENDENT_AMBULATORY_CARE_PROVIDER_SITE_OTHER): Payer: 59

## 2021-04-12 DIAGNOSIS — E559 Vitamin D deficiency, unspecified: Secondary | ICD-10-CM

## 2021-04-12 DIAGNOSIS — E663 Overweight: Secondary | ICD-10-CM | POA: Diagnosis not present

## 2021-04-12 DIAGNOSIS — R5383 Other fatigue: Secondary | ICD-10-CM | POA: Diagnosis not present

## 2021-04-12 DIAGNOSIS — E785 Hyperlipidemia, unspecified: Secondary | ICD-10-CM | POA: Diagnosis not present

## 2021-04-12 LAB — COMPREHENSIVE METABOLIC PANEL
ALT: 17 U/L (ref 0–35)
AST: 20 U/L (ref 0–37)
Albumin: 4.1 g/dL (ref 3.5–5.2)
Alkaline Phosphatase: 105 U/L (ref 39–117)
BUN: 22 mg/dL (ref 6–23)
CO2: 26 mEq/L (ref 19–32)
Calcium: 9.5 mg/dL (ref 8.4–10.5)
Chloride: 107 mEq/L (ref 96–112)
Creatinine, Ser: 0.72 mg/dL (ref 0.40–1.20)
GFR: 91.41 mL/min (ref >60.00)
Glucose, Bld: 140 mg/dL — ABNORMAL HIGH (ref 70–99)
Potassium: 3.6 mEq/L (ref 3.5–5.1)
Sodium: 142 mEq/L (ref 135–145)
Total Bilirubin: 0.3 mg/dL (ref 0.2–1.2)
Total Protein: 6.5 g/dL (ref 6.0–8.3)

## 2021-04-12 LAB — CBC WITH DIFFERENTIAL/PLATELET
Basophils Absolute: 0.1 10*3/uL (ref 0.0–0.1)
Basophils Relative: 1.2 % (ref 0.0–3.0)
Eosinophils Absolute: 0.1 10*3/uL (ref 0.0–0.7)
Eosinophils Relative: 2.5 % (ref 0.0–5.0)
HCT: 35.3 % — ABNORMAL LOW (ref 36.0–46.0)
Hemoglobin: 12.4 g/dL (ref 12.0–15.0)
Lymphocytes Relative: 36 % (ref 12.0–46.0)
Lymphs Abs: 2 10*3/uL (ref 0.7–4.0)
MCHC: 35 g/dL (ref 30.0–36.0)
MCV: 92.9 fl (ref 78.0–100.0)
Monocytes Absolute: 0.4 10*3/uL (ref 0.1–1.0)
Monocytes Relative: 8.1 % (ref 3.0–12.0)
Neutro Abs: 2.8 10*3/uL (ref 1.4–7.7)
Neutrophils Relative %: 52.2 % (ref 43.0–77.0)
Platelets: 271 10*3/uL (ref 150.0–400.0)
RBC: 3.8 Mil/uL — ABNORMAL LOW (ref 3.87–5.11)
RDW: 13 % (ref 11.5–15.5)
WBC: 5.4 10*3/uL (ref 4.0–10.5)

## 2021-04-12 LAB — IBC + FERRITIN
Ferritin: 143.7 ng/mL (ref 10.0–291.0)
Iron: 66 ug/dL (ref 42–145)
Saturation Ratios: 21.3 % (ref 20.0–50.0)
TIBC: 309.4 ug/dL (ref 250.0–450.0)
Transferrin: 221 mg/dL (ref 212.0–360.0)

## 2021-04-12 LAB — LIPID PANEL
Cholesterol: 208 mg/dL — ABNORMAL HIGH (ref 0–200)
HDL: 52.7 mg/dL (ref >39.00)
NonHDL: 155.67
Total CHOL/HDL Ratio: 4
Triglycerides: 359 mg/dL — ABNORMAL HIGH (ref 0.0–149.0)
VLDL: 71.8 mg/dL — ABNORMAL HIGH (ref 0.0–40.0)

## 2021-04-12 LAB — TSH: TSH: 2.41 u[IU]/mL (ref 0.35–5.50)

## 2021-04-12 LAB — LDL CHOLESTEROL, DIRECT: Direct LDL: 93 mg/dL

## 2021-04-12 LAB — VITAMIN B12: Vitamin B-12: 302 pg/mL (ref 211–911)

## 2021-04-12 LAB — VITAMIN D 25 HYDROXY (VIT D DEFICIENCY, FRACTURES): VITD: 18.68 ng/mL — ABNORMAL LOW (ref 30.00–100.00)

## 2021-04-12 MED ORDER — VITAMIN D (ERGOCALCIFEROL) 1.25 MG (50000 UNIT) PO CAPS: 50000.0000 [IU] | ORAL_CAPSULE | ORAL | 0 refills | Status: DC

## 2021-04-14 ENCOUNTER — Encounter: Payer: Self-pay | Admitting: Physician Assistant

## 2021-05-02 ENCOUNTER — Ambulatory Visit: Payer: 59 | Admitting: Physician Assistant

## 2021-05-02 ENCOUNTER — Encounter: Payer: Self-pay | Admitting: Physician Assistant

## 2021-05-02 VITALS — BP 109/72 | HR 57 | Resp 20 | Ht 67.0 in | Wt 175.0 lb

## 2021-05-02 DIAGNOSIS — R413 Other amnesia: Secondary | ICD-10-CM
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
## 2021-05-02 NOTE — Patient Instructions (Addendum)
It was a pleasure to see you today at our office.  ? ?Recommendations: ? ?MRI of the brain, the radiology office will call you to arrange you appointment ?Replenish Vit B12 and D  ?Follow up 1 month ? ?RECOMMENDATIONS FOR ALL PATIENTS WITH MEMORY PROBLEMS: ?1. Continue to exercise (Recommend 30 minutes of walking everyday, or 3 hours every week) ?2. Increase social interactions - continue going to [place] and enjoy social gatherings with friends and family ?3. Eat healthy, avoid fried foods and eat more fruits and vegetables ?4. Maintain adequate blood pressure, blood sugar, and blood cholesterol level. Reducing the risk of stroke and cardiovascular disease also helps promoting better memory. ?5. Avoid stressful situations. Live a simple life and avoid aggravations. Organize your time and prepare for the next day in anticipation. ?6. Sleep well, avoid any interruptions of sleep and avoid any distractions in the bedroom that may interfere with adequate sleep quality ?7. Avoid sugar, avoid sweets as there is a strong link between excessive sugar intake, diabetes, and cognitive impairment ?We discussed the Mediterranean diet, which has been shown to help patients reduce the risk of progressive memory disorders and reduces cardiovascular risk. This includes eating fish, eat fruits and green leafy vegetables, nuts like almonds and hazelnuts, walnuts, and also use olive oil. Avoid fast foods and fried foods as much as possible. Avoid sweets and sugar as sugar use has been linked to worsening of memory function. ? ?There is always a concern of gradual progression of memory problems. If this is the case, then we may need to adjust level of care according to patient needs. Support, both to the patient and caregiver, should then be put into place.  ? ? ? ? ?FALL PRECAUTIONS: Be cautious when walking. Scan the area for obstacles that may increase the risk of trips and falls. When getting up in the mornings, sit up at the edge  of the bed for a few minutes before getting out of bed. Consider elevating the bed at the head end to avoid drop of blood pressure when getting up. Walk always in a well-lit room (use night lights in the walls). Avoid area rugs or power cords from appliances in the middle of the walkways. Use a walker or a cane if necessary and consider physical therapy for balance exercise. Get your eyesight checked regularly. ? ?FINANCIAL OVERSIGHT: Supervision, especially oversight when making financial decisions or transactions is also recommended. ? ?HOME SAFETY: Consider the safety of the kitchen when operating appliances like stoves, microwave oven, and blender. Consider having supervision and share cooking responsibilities until no longer able to participate in those. Accidents with firearms and other hazards in the house should be identified and addressed as well. ? ? ?ABILITY TO BE LEFT ALONE: If patient is unable to contact 911 operator, consider using LifeLine, or when the need is there, arrange for someone to stay with patients. Smoking is a fire hazard, consider supervision or cessation. Risk of wandering should be assessed by caregiver and if detected at any point, supervision and safe proof recommendations should be instituted. ? ?MEDICATION SUPERVISION: Inability to self-administer medication needs to be constantly addressed. Implement a mechanism to ensure safe administration of the medications. ? ? ?DRIVING: Regarding driving, in patients with progressive memory problems, driving will be impaired. We advise to have someone else do the driving if trouble finding directions or if minor accidents are reported. Independent driving assessment is available to determine safety of driving. ? ? ?If you are interested  in the driving assessment, you can contact the following: ? ?[facility] [employer] in [place] [phone] ? ?[facility] [phone] ? ?[facility]  [phone] ? ?[facility] [phone] or [phone] ? ? ? ?Mediterranean Diet ?A Mediterranean diet refers to food and lifestyle choices that are based on the traditions of countries located on the [employer]. This way of eating has been shown to help prevent certain conditions and improve outcomes for people who have chronic diseases, like kidney disease and heart disease. ?What are tips for following this plan? ?Lifestyle  ?Cook and eat meals together with your family, when possible. ?Drink enough fluid to keep your urine clear or pale yellow. ?Be physically active every day. This includes: ?Aerobic exercise like running or swimming. ?Leisure activities like gardening, walking, or housework. ?Get 7-8 hours of sleep each night. ?If recommended by your health care provider, drink red wine in moderation. This means 1 glass a day for nonpregnant women and 2 glasses a day for men. A glass of wine equals 5 oz (150 mL). ?Reading food labels  ?Check the serving size of packaged foods. For foods such as rice and pasta, the serving size refers to the amount of cooked product, not dry. ?Check the total fat in packaged foods. Avoid foods that have saturated fat or trans fats. ?Check the ingredients list for added sugars, such as corn syrup. ?Shopping  ?At the grocery store, buy most of your food from the areas near the walls of the store. This includes: ?Fresh fruits and vegetables (produce). ?Grains, beans, nuts, and seeds. Some of these may be available in unpackaged forms or large amounts (in bulk). ?Fresh seafood. ?Poultry and eggs. ?Low-fat dairy products. ?Buy whole ingredients instead of prepackaged foods. ?Buy fresh fruits and vegetables in-season from local farmers markets. ?Buy frozen fruits and vegetables in resealable bags. ?If you do not have access to quality fresh seafood, buy precooked frozen shrimp or canned fish, such as tuna, salmon, or sardines. ?Buy small amounts of raw or cooked  vegetables, salads, or olives from the deli or salad bar at your store. ?Stock your pantry so you always have certain foods on hand, such as olive oil, canned tuna, canned tomatoes, rice, pasta, and beans. ?Cooking  ?Cook foods with extra-virgin olive oil instead of using butter or other vegetable oils. ?Have meat as a side dish, and have vegetables or grains as your main dish. This means having meat in small portions or adding small amounts of meat to foods like pasta or stew. ?Use beans or vegetables instead of meat in common dishes like chili or lasagna. ?Experiment with different cooking methods. Try roasting or broiling vegetables instead of steaming or saut?eing them. ?Add frozen vegetables to soups, stews, pasta, or rice. ?Add nuts or seeds for added healthy fat at each meal. You can add these to yogurt, salads, or vegetable dishes. ?Marinate fish or vegetables using olive oil, lemon juice, garlic, and fresh herbs. ?Meal planning  ?Plan to eat 1 vegetarian meal one day each week. Try to work up to 2 vegetarian meals, if possible. ?Eat seafood 2 or more times a week. ?Have healthy snacks readily available, such as: ?Vegetable sticks with hummus. ?[place] yogurt. ?Fruit and nut trail mix. ?Eat balanced meals throughout the week. This includes: ?Fruit: 2-3 servings a day ?Vegetables: 4-5 servings a day ?Low-fat dairy: 2 servings a day ?Fish, poultry, or lean meat: 1 serving a day ?Beans and legumes: 2 or more servings a week ?Nuts  and seeds: 1-2 servings a day ?Whole grains: 6-8 servings a day ?Extra-virgin olive oil: 3-4 servings a day ?Limit red meat and sweets to only a few servings a month ?What are my food choices? ?Mediterranean diet ?Recommended ?Grains: Whole-grain pasta. Brown rice. Bulgar wheat. Polenta. Couscous. Whole-wheat bread. Orpah Cobb. ?Vegetables: Artichokes. Beets. Broccoli. Cabbage. Carrots. Eggplant. Green beans. Chard. Kale. Spinach. Onions. Leeks. Peas. Squash. Tomatoes. Peppers.  Radishes. ?Fruits: Apples. Apricots. Avocado. Berries. Bananas. Cherries. Dates. Figs. Grapes. Lemons. Melon. Oranges. Peaches. Plums. Pomegranate. ?Meats and other protein foods: Beans. Almonds. Sunflower seeds. Pine nuts. Peanuts. Cod.

## 2021-05-17 ENCOUNTER — Other Ambulatory Visit: Payer: Self-pay | Admitting: Physician Assistant

## 2021-05-17 DIAGNOSIS — Z1231 Encounter for screening mammogram for malignant neoplasm of breast: Secondary | ICD-10-CM

## 2021-05-27 ENCOUNTER — Ambulatory Visit
Admission: RE | Admit: 2021-05-27 | Discharge: 2021-05-27 | Disposition: A | Discharge status: Home / Self Care | Payer: 59 | Source: Ambulatory Visit | Attending: Physician Assistant | Admitting: Physician Assistant

## 2021-05-27 DIAGNOSIS — Z1231 Encounter for screening mammogram for malignant neoplasm of breast: Secondary | ICD-10-CM

## 2021-06-01 ENCOUNTER — Ambulatory Visit: Payer: 59 | Admitting: Physician Assistant

## 2021-06-08 ENCOUNTER — Ambulatory Visit: Payer: 59 | Admitting: Physician Assistant

## 2021-06-12 ENCOUNTER — Ambulatory Visit
Admission: RE | Admit: 2021-06-12 | Discharge: 2021-06-12 | Disposition: A | Discharge status: Home / Self Care | Payer: 59 | Source: Ambulatory Visit | Attending: Physician Assistant | Admitting: Physician Assistant

## 2021-06-12 MED ORDER — GADOBENATE DIMEGLUMINE 529 MG/ML IV SOLN
15.0000 mL | Freq: Once | INTRAVENOUS | Status: AC | PRN
  Administered 2021-06-12: 15 mL via INTRAVENOUS

## 2021-06-15 NOTE — Progress Notes (Signed)
Please inform patient that the MRI of the brain is completely normal! No areas of abnormality to suspect dementia.

## 2021-07-05 ENCOUNTER — Encounter: Payer: Self-pay | Admitting: Physician Assistant

## 2021-07-05 ENCOUNTER — Ambulatory Visit: Payer: 59 | Admitting: Physician Assistant

## 2021-07-05 VITALS — Resp 18 | Ht 67.0 in

## 2021-07-05 DIAGNOSIS — R413 Other amnesia: Secondary | ICD-10-CM | POA: Diagnosis not present
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
## 2021-07-15 ENCOUNTER — Encounter: Payer: Self-pay | Admitting: Physician Assistant

## 2021-07-15 ENCOUNTER — Ambulatory Visit: Payer: 59 | Admitting: Physician Assistant

## 2021-07-15 VITALS — BP 100/60 | HR 84 | Temp 98.3°F | Ht 67.0 in | Wt 175.2 lb

## 2021-07-15 DIAGNOSIS — R5383 Other fatigue: Secondary | ICD-10-CM

## 2021-07-15 DIAGNOSIS — E538 Deficiency of other specified B group vitamins: Secondary | ICD-10-CM

## 2021-07-15 DIAGNOSIS — M79604 Pain in right leg: Secondary | ICD-10-CM

## 2021-07-15 DIAGNOSIS — E559 Vitamin D deficiency, unspecified: Secondary | ICD-10-CM

## 2021-07-15 DIAGNOSIS — F32A Depression, unspecified: Secondary | ICD-10-CM

## 2021-07-15 DIAGNOSIS — R7309 Other abnormal glucose: Secondary | ICD-10-CM

## 2021-07-15 DIAGNOSIS — M79605 Pain in left leg: Secondary | ICD-10-CM

## 2021-07-15 DIAGNOSIS — F419 Anxiety disorder, unspecified: Secondary | ICD-10-CM

## 2021-07-15 DIAGNOSIS — B009 Herpesviral infection, unspecified: Secondary | ICD-10-CM | POA: Diagnosis not present

## 2021-07-15 MED ORDER — VITAMIN D (ERGOCALCIFEROL) 1.25 MG (50000 UNIT) PO CAPS: 50000.0000 [IU] | ORAL_CAPSULE | ORAL | 0 refills | Status: DC

## 2021-07-15 MED ORDER — ESCITALOPRAM OXALATE 10 MG PO TABS: 10.0000 mg | ORAL_TABLET | Freq: Every day | ORAL | 1 refills | Status: DC

## 2021-07-15 MED ORDER — VALACYCLOVIR HCL 1 G PO TABS: ORAL_TABLET | ORAL | 1 refills | Status: DC

## 2021-07-15 NOTE — Addendum Note (Signed)
Addended by: Haynes Bast on: 07/15/2021 04:53 PM   Modules accepted: Orders

## 2021-07-15 NOTE — Patient Instructions (Signed)
It was great to see you!  Start 1/2 tablet of lexapro daily (5 mg) x 2 weeks and then increase to 10 mg daily Message me or follow-up in 1 month to check in on this Tell someone you are starting this medication to be on the lookout for any severe mood changes  If you develop suicidal thoughts, please tell someone and immediately proceed to our local 24/7 crisis center, [facility]. [address], [place], [place] [address] [phone].  We will update blood work today  I'm going to order ultrasound test for your legs -- someone will be in touch with scheduling this  I will send in more vit D and valtrex now  Let's follow-up in 3 months, sooner if you have concerns.  If a referral was placed today --> you will be contacted for an appointment. Please note that routine referrals can sometimes take up to 3-4 weeks to process. Please call our office if you haven't heard anything after this time frame.  If blood work, urine studies, or any imaging was ordered today -->  we will release your results to you on your MyChart account (if you have chosen to sign up for this) with further instructions. You may see the results before I do, but when I review them I will send you a message with my report or have my staff call you if things need to be discussed. Please reply to my message with any questions.   Take care,  [name] PA-C
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
## 2021-07-16 LAB — CBC WITH DIFFERENTIAL/PLATELET
Absolute Monocytes: 416 cells/uL (ref 200–950)
Basophils Absolute: 20 cells/uL (ref 0–200)
Basophils Relative: 0.3 %
Eosinophils Absolute: 59 cells/uL (ref 15–500)
Eosinophils Relative: 0.9 %
HCT: 37.5 % (ref 35.0–45.0)
Hemoglobin: 13.4 g/dL (ref 11.7–15.5)
Lymphs Abs: 1651 cells/uL (ref 850–3900)
MCH: 32.8 pg (ref 27.0–33.0)
MCHC: 35.7 g/dL (ref 32.0–36.0)
MCV: 91.7 fL (ref 80.0–100.0)
MPV: 10 fL (ref 7.5–12.5)
Monocytes Relative: 6.4 %
Neutro Abs: 4355 cells/uL (ref 1500–7800)
Neutrophils Relative %: 67 %
Platelets: 285 10*3/uL (ref 140–400)
RBC: 4.09 10*6/uL (ref 3.80–5.10)
RDW: 12.4 % (ref 11.0–15.0)
Total Lymphocyte: 25.4 %
WBC: 6.5 10*3/uL (ref 3.8–10.8)

## 2021-07-16 LAB — COMPREHENSIVE METABOLIC PANEL
AG Ratio: 1.8 (calc) (ref 1.0–2.5)
ALT: 16 U/L (ref 6–29)
AST: 20 U/L (ref 10–35)
Albumin: 4.3 g/dL (ref 3.6–5.1)
Alkaline phosphatase (APISO): 79 U/L (ref 37–153)
BUN: 17 mg/dL (ref 7–25)
CO2: 26 mmol/L (ref 20–32)
Calcium: 9.6 mg/dL (ref 8.6–10.4)
Chloride: 106 mmol/L (ref 98–110)
Creat: 0.89 mg/dL (ref 0.50–1.03)
Globulin: 2.4 g/dL (calc) (ref 1.9–3.7)
Glucose, Bld: 87 mg/dL (ref 65–99)
Potassium: 4.8 mmol/L (ref 3.5–5.3)
Sodium: 140 mmol/L (ref 135–146)
Total Bilirubin: 0.7 mg/dL (ref 0.2–1.2)
Total Protein: 6.7 g/dL (ref 6.1–8.1)

## 2021-07-16 LAB — CK: Total CK: 88 U/L (ref 29–143)

## 2021-07-16 LAB — HEMOGLOBIN A1C
Hgb A1c MFr Bld: 5.2 % of total Hgb (ref <5.7)
Mean Plasma Glucose: 103 mg/dL
eAG (mmol/L): 5.7 mmol/L

## 2021-07-16 LAB — VITAMIN B12: Vitamin B-12: 515 pg/mL (ref 200–1100)

## 2021-07-16 LAB — VITAMIN D 25 HYDROXY (VIT D DEFICIENCY, FRACTURES): Vit D, 25-Hydroxy: 67 ng/mL (ref 30–100)

## 2021-07-17 ENCOUNTER — Encounter: Payer: Self-pay | Admitting: Physician Assistant

## 2021-09-06 ENCOUNTER — Other Ambulatory Visit: Payer: Self-pay | Admitting: *Deleted

## 2021-09-06 MED ORDER — ESCITALOPRAM OXALATE 10 MG PO TABS: 10.0000 mg | ORAL_TABLET | Freq: Every day | ORAL | 1 refills | Status: DC

## 2021-11-22 ENCOUNTER — Other Ambulatory Visit: Payer: Self-pay | Admitting: *Deleted

## 2021-11-22 MED ORDER — ESCITALOPRAM OXALATE 10 MG PO TABS: 10.0000 mg | ORAL_TABLET | Freq: Every day | ORAL | 0 refills | Status: DC

## 2021-12-26 ENCOUNTER — Encounter: Payer: Self-pay | Admitting: Physician Assistant

## 2021-12-26 ENCOUNTER — Other Ambulatory Visit: Payer: Self-pay | Admitting: Physician Assistant

## 2021-12-26 MED ORDER — ESCITALOPRAM OXALATE 20 MG PO TABS: 20.0000 mg | ORAL_TABLET | Freq: Every day | ORAL | 0 refills | Status: DC

## 2021-12-27 ENCOUNTER — Encounter: Payer: Self-pay | Admitting: Physician Assistant

## 2021-12-27 ENCOUNTER — Ambulatory Visit: Payer: 59 | Admitting: Physician Assistant

## 2021-12-27 VITALS — BP 130/80 | HR 76 | Temp 98.0°F | Ht 67.0 in | Wt 175.0 lb

## 2021-12-27 DIAGNOSIS — F419 Anxiety disorder, unspecified: Secondary | ICD-10-CM | POA: Diagnosis not present

## 2021-12-27 DIAGNOSIS — E785 Hyperlipidemia, unspecified: Secondary | ICD-10-CM

## 2021-12-27 DIAGNOSIS — M79674 Pain in right toe(s): Secondary | ICD-10-CM | POA: Diagnosis not present

## 2021-12-27 DIAGNOSIS — F32A Depression, unspecified: Secondary | ICD-10-CM

## 2021-12-27 DIAGNOSIS — E663 Overweight: Secondary | ICD-10-CM

## 2021-12-27 MED ORDER — ZEPBOUND 2.5 MG/0.5ML ~~LOC~~ SOAJ: 2.5000 mg | SUBCUTANEOUS | 0 refills | Status: DC

## 2021-12-27 NOTE — Patient Instructions (Signed)
It was great to see you!  I will send in Zepbound for you to start Message me if you would like to continue this after starting it  I will place referral to podiatry  Take care,  [name] PA-C I will send in Zep
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
## 2021-12-28 ENCOUNTER — Encounter: Payer: Self-pay | Admitting: Physician Assistant

## 2021-12-28 ENCOUNTER — Other Ambulatory Visit: Payer: Self-pay | Admitting: Physician Assistant

## 2021-12-28 DIAGNOSIS — J301 Allergic rhinitis due to pollen: Secondary | ICD-10-CM

## 2021-12-28 DIAGNOSIS — G8929 Other chronic pain: Secondary | ICD-10-CM

## 2022-01-02 NOTE — Telephone Encounter (Signed)
Received fax from pharmacy PA needed for Zepbound 2.5 mg. Called plan at [phone] which is [employer] and spoke to [facility] told her calling to do PA for Zepbound. Answered questions. Ronny Bacon said it is denied due to plan exclusion. Told her okay thank you.

## 2022-01-02 NOTE — Telephone Encounter (Signed)
Virgel Manifold is denied by insurance please advise.

## 2022-01-03 ENCOUNTER — Encounter: Payer: Self-pay | Admitting: Physician Assistant

## 2022-01-03 ENCOUNTER — Ambulatory Visit: Payer: 59 | Admitting: Physician Assistant

## 2022-01-03 VITALS — BP 90/64 | HR 70 | Temp 97.5°F | Ht 67.0 in | Wt 175.0 lb

## 2022-01-03 DIAGNOSIS — H029 Unspecified disorder of eyelid: Secondary | ICD-10-CM | POA: Diagnosis not present

## 2022-01-03 MED ORDER — ERYTHROMYCIN 5 MG/GM OP OINT: TOPICAL_OINTMENT | OPHTHALMIC | 0 refills | Status: DC

## 2022-01-03 MED ORDER — VALACYCLOVIR HCL 1 G PO TABS: 1000.0000 mg | ORAL_TABLET | Freq: Three times a day (TID) | ORAL | 0 refills | Status: AC

## 2022-01-03 NOTE — Patient Instructions (Signed)
It was great to see you!  Start valtrex for possible shingles Start topical erythromycin ointment for eye infection  Call us with any concerns.  Take care,  [name] PA-C
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
## 2022-01-19 ENCOUNTER — Other Ambulatory Visit: Payer: Self-pay | Admitting: Physician Assistant

## 2022-03-08 ENCOUNTER — Ambulatory Visit: Payer: 59 | Admitting: Physician Assistant

## 2022-03-08 ENCOUNTER — Encounter: Payer: Self-pay | Admitting: Physician Assistant

## 2022-03-08 VITALS — BP 106/70 | HR 74 | Temp 97.8°F | Ht 67.0 in | Wt 169.0 lb

## 2022-03-08 DIAGNOSIS — R413 Other amnesia: Secondary | ICD-10-CM | POA: Diagnosis not present

## 2022-03-08 DIAGNOSIS — R051 Acute cough: Secondary | ICD-10-CM

## 2022-03-08 MED ORDER — AMOXICILLIN-POT CLAVULANATE 875-125 MG PO TABS: 1.0000 | ORAL_TABLET | Freq: Two times a day (BID) | ORAL | 0 refills | Status: DC

## 2022-03-08 MED ORDER — FLUCONAZOLE 150 MG PO TABS: 150.0000 mg | ORAL_TABLET | Freq: Once | ORAL | 0 refills | Status: AC
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
## 2022-03-29 ENCOUNTER — Encounter: Payer: Self-pay | Admitting: Physician Assistant

## 2022-03-30 ENCOUNTER — Other Ambulatory Visit: Payer: Self-pay | Admitting: Physician Assistant

## 2022-03-30 MED ORDER — ACYCLOVIR 400 MG PO TABS: ORAL_TABLET | ORAL | 1 refills | Status: AC

## 2022-04-26 ENCOUNTER — Other Ambulatory Visit: Payer: Self-pay | Admitting: Physician Assistant

## 2022-04-27 ENCOUNTER — Other Ambulatory Visit: Payer: Self-pay

## 2022-04-27 MED ORDER — ESCITALOPRAM OXALATE 20 MG PO TABS: ORAL_TABLET | ORAL | 0 refills | Status: DC

## 2022-05-13 ENCOUNTER — Other Ambulatory Visit: Payer: Self-pay | Admitting: Physician Assistant

## 2022-05-13 DIAGNOSIS — G8929 Other chronic pain: Secondary | ICD-10-CM

## 2022-05-13 DIAGNOSIS — J301 Allergic rhinitis due to pollen: Secondary | ICD-10-CM
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
## 2022-07-19 ENCOUNTER — Ambulatory Visit: Payer: 59 | Admitting: Physician Assistant

## 2022-07-19 VITALS — BP 102/70 | HR 69 | Temp 97.5°F | Ht 67.0 in | Wt 168.2 lb

## 2022-07-19 DIAGNOSIS — E538 Deficiency of other specified B group vitamins: Secondary | ICD-10-CM

## 2022-07-19 DIAGNOSIS — R5383 Other fatigue: Secondary | ICD-10-CM

## 2022-07-19 DIAGNOSIS — F902 Attention-deficit hyperactivity disorder, combined type: Secondary | ICD-10-CM | POA: Diagnosis not present

## 2022-07-19 DIAGNOSIS — E559 Vitamin D deficiency, unspecified: Secondary | ICD-10-CM | POA: Diagnosis not present

## 2022-07-19 DIAGNOSIS — R29818 Other symptoms and signs involving the nervous system: Secondary | ICD-10-CM | POA: Diagnosis not present

## 2022-07-19 LAB — CBC WITH DIFFERENTIAL/PLATELET
Basophils Absolute: 0 10*3/uL (ref 0.0–0.1)
Basophils Relative: 0.5 % (ref 0.0–3.0)
Eosinophils Absolute: 0.1 10*3/uL (ref 0.0–0.7)
Eosinophils Relative: 2.2 % (ref 0.0–5.0)
HCT: 41.9 % (ref 36.0–46.0)
Hemoglobin: 14.1 g/dL (ref 12.0–15.0)
Lymphocytes Relative: 31.4 % (ref 12.0–46.0)
Lymphs Abs: 1.8 10*3/uL (ref 0.7–4.0)
MCHC: 33.6 g/dL (ref 30.0–36.0)
MCV: 95.8 fl (ref 78.0–100.0)
Monocytes Absolute: 0.4 10*3/uL (ref 0.1–1.0)
Monocytes Relative: 7.6 % (ref 3.0–12.0)
Neutro Abs: 3.4 10*3/uL (ref 1.4–7.7)
Neutrophils Relative %: 58.3 % (ref 43.0–77.0)
Platelets: 273 10*3/uL (ref 150.0–400.0)
RBC: 4.38 Mil/uL (ref 3.87–5.11)
RDW: 12.8 % (ref 11.5–15.5)
WBC: 5.8 10*3/uL (ref 4.0–10.5)

## 2022-07-19 LAB — COMPREHENSIVE METABOLIC PANEL
ALT: 20 U/L (ref 0–35)
AST: 25 U/L (ref 0–37)
Albumin: 4.4 g/dL (ref 3.5–5.2)
Alkaline Phosphatase: 82 U/L (ref 39–117)
BUN: 21 mg/dL (ref 6–23)
CO2: 29 mEq/L (ref 19–32)
Calcium: 10 mg/dL (ref 8.4–10.5)
Chloride: 102 mEq/L (ref 96–112)
Creatinine, Ser: 0.7 mg/dL (ref 0.40–1.20)
GFR: 93.72 mL/min (ref >60.00)
Glucose, Bld: 98 mg/dL (ref 70–99)
Potassium: 4.3 mEq/L (ref 3.5–5.1)
Sodium: 138 mEq/L (ref 135–145)
Total Bilirubin: 1.1 mg/dL (ref 0.2–1.2)
Total Protein: 7.1 g/dL (ref 6.0–8.3)

## 2022-07-19 LAB — TSH: TSH: 1.78 u[IU]/mL (ref 0.35–5.50)

## 2022-07-19 LAB — VITAMIN B12: Vitamin B-12: 389 pg/mL (ref 211–911)

## 2022-07-19 LAB — T3, FREE: T3, Free: 3.4 pg/mL (ref 2.3–4.2)

## 2022-07-19 LAB — T4, FREE: Free T4: 0.88 ng/dL (ref 0.60–1.60)

## 2022-07-19 LAB — VITAMIN D 25 HYDROXY (VIT D DEFICIENCY, FRACTURES): VITD: 25.31 ng/mL — ABNORMAL LOW (ref 30.00–100.00)

## 2022-07-19 MED ORDER — LISDEXAMFETAMINE DIMESYLATE 30 MG PO CAPS: 30.0000 mg | ORAL_CAPSULE | Freq: Every day | ORAL | 0 refills | Status: DC

## 2022-07-19 NOTE — Patient Instructions (Signed)
It was great to see you!  Home sleep test ordered -- [facility] will be in touch with scheduling this  Blood work today  I have sent in Vyvanse 30 mg  Let's follow-up in 4-6 weeks, sooner if you have concerns.  Take care,  [name] PA-C

## 2022-08-16 ENCOUNTER — Encounter (INDEPENDENT_AMBULATORY_CARE_PROVIDER_SITE_OTHER): Payer: Self-pay

## 2022-08-28 ENCOUNTER — Ambulatory Visit: Payer: 59 | Admitting: Physician Assistant

## 2022-08-28 ENCOUNTER — Encounter: Payer: Self-pay | Admitting: Physician Assistant

## 2022-08-28 VITALS — BP 112/64 | HR 60 | Temp 97.8°F | Ht 67.0 in | Wt 157.5 lb

## 2022-08-28 DIAGNOSIS — F902 Attention-deficit hyperactivity disorder, combined type: Secondary | ICD-10-CM

## 2022-08-28 DIAGNOSIS — M542 Cervicalgia: Secondary | ICD-10-CM

## 2022-08-28 MED ORDER — CYCLOBENZAPRINE HCL 5 MG PO TABS: 5.0000 mg | ORAL_TABLET | Freq: Three times a day (TID) | ORAL | 1 refills | Status: DC | PRN

## 2022-08-28 MED ORDER — BUPROPION HCL ER (XL) 150 MG PO TB24: 150.0000 mg | ORAL_TABLET | Freq: Every day | ORAL | 1 refills | Status: DC
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
## 2022-08-28 NOTE — Patient Instructions (Signed)
It was great to see you!  For your ears: Start regular use of over the counter antihistamines such as Zyrtec (cetirizine), Claritin (loratadine), Allegra (fexofenadine), or Xyzal (levocetirizine) daily x 3-5 days.  If dizziness persists, let me know  For your ADHD: add in Wellbutrin 150 mg XL daily Follow-up in 1 month to discuss and do your Comprehensive Physical Exam (CPE) preventive care annual visit  For your neck pain: Take 15 mg meloxicam as needed daily for neck pain and inflammation Start as needed flexeril  Take care,  [name] PA-C

## 2022-08-30 ENCOUNTER — Encounter: Payer: Self-pay | Admitting: Physician Assistant

## 2022-08-31 ENCOUNTER — Ambulatory Visit (INDEPENDENT_AMBULATORY_CARE_PROVIDER_SITE_OTHER): Payer: 59

## 2022-08-31 ENCOUNTER — Other Ambulatory Visit (HOSPITAL_BASED_OUTPATIENT_CLINIC_OR_DEPARTMENT_OTHER): Payer: Self-pay

## 2022-08-31 ENCOUNTER — Ambulatory Visit (HOSPITAL_BASED_OUTPATIENT_CLINIC_OR_DEPARTMENT_OTHER): Payer: 59 | Admitting: Student

## 2022-08-31 ENCOUNTER — Encounter (HOSPITAL_BASED_OUTPATIENT_CLINIC_OR_DEPARTMENT_OTHER): Payer: Self-pay | Admitting: Student

## 2022-08-31 DIAGNOSIS — M542 Cervicalgia: Secondary | ICD-10-CM

## 2022-08-31 MED ORDER — MELOXICAM 15 MG PO TABS
15.0000 mg | ORAL_TABLET | Freq: Every day | ORAL | 0 refills | Status: AC
  Filled 2022-08-31: qty 14, 14d supply, fill #0

## 2022-08-31 MED ORDER — METHOCARBAMOL 500 MG PO TABS
500.0000 mg | ORAL_TABLET | Freq: Three times a day (TID) | ORAL | 0 refills | Status: DC | PRN
  Filled 2022-08-31: qty 42, 14d supply, fill #0
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
## 2022-09-06 ENCOUNTER — Encounter: Payer: Self-pay | Admitting: Neurology

## 2022-09-06 ENCOUNTER — Ambulatory Visit: Payer: 59 | Admitting: Neurology

## 2022-09-06 VITALS — BP 113/67 | HR 60 | Ht 67.0 in | Wt 155.0 lb

## 2022-09-06 DIAGNOSIS — F5104 Psychophysiologic insomnia: Secondary | ICD-10-CM | POA: Insufficient documentation

## 2022-09-06 DIAGNOSIS — F901 Attention-deficit hyperactivity disorder, predominantly hyperactive type: Secondary | ICD-10-CM | POA: Diagnosis not present

## 2022-09-06 DIAGNOSIS — R519 Headache, unspecified: Secondary | ICD-10-CM | POA: Diagnosis not present

## 2022-09-06 DIAGNOSIS — G4711 Idiopathic hypersomnia with long sleep time: Secondary | ICD-10-CM | POA: Diagnosis not present

## 2022-09-06 NOTE — Patient Instructions (Signed)
  ASSESSMENT AND PLAN 62 y.o. year old female  here with: chronic insomnia and new onset vertigo, escalating headaches.     1) chronic insomnia- 'My mind is racing I am anxious " . She rports delayed sleep onset but now with more fragmented sleep, non restorative sleep, and with some headaches, escalating over the last 10 years   2) 4 kinds of headaches,  some morning headaches, some  later in day HA (she can sleep them off), rarely with nausea,  sinus  HA with allergies.   3) new onset vertigo, onset this spring - motion sickness.    I would very much like to screen Mrs. Dyment for possible sleep apnea I do not have an very high suspicion that this is present but I would like to know that there is no hypoxia at night I would like to see if she has periodic movements, if she grinds her teeth at night, and also feel that we need to make sure that there is no other cause of arousals form sleep.   The  un-refreshing non-restorative sleep quality can indicate hypersomnia of idiopathic origin.  I will consider this a diagnosis of exclusion.   HST , then follow with an in -lab  Sleep study if negative.   I plan to follow up either personally or through our NP within 3-6 months.
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
## 2022-09-11 ENCOUNTER — Ambulatory Visit: Payer: 59 | Attending: Student | Admitting: Physical Therapy

## 2022-09-11 ENCOUNTER — Other Ambulatory Visit: Payer: Self-pay

## 2022-09-11 DIAGNOSIS — R293 Abnormal posture: Secondary | ICD-10-CM | POA: Insufficient documentation

## 2022-09-11 DIAGNOSIS — M542 Cervicalgia: Secondary | ICD-10-CM | POA: Insufficient documentation
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
## 2022-09-12 ENCOUNTER — Ambulatory Visit (INDEPENDENT_AMBULATORY_CARE_PROVIDER_SITE_OTHER): Payer: 59 | Admitting: Physician Assistant

## 2022-09-12 ENCOUNTER — Encounter: Payer: Self-pay | Admitting: Physician Assistant

## 2022-09-12 VITALS — BP 110/80 | HR 63 | Temp 97.1°F | Ht 66.0 in | Wt 155.0 lb

## 2022-09-12 DIAGNOSIS — Z1231 Encounter for screening mammogram for malignant neoplasm of breast: Secondary | ICD-10-CM | POA: Diagnosis not present

## 2022-09-12 DIAGNOSIS — F902 Attention-deficit hyperactivity disorder, combined type: Secondary | ICD-10-CM

## 2022-09-12 DIAGNOSIS — F419 Anxiety disorder, unspecified: Secondary | ICD-10-CM | POA: Diagnosis not present

## 2022-09-12 DIAGNOSIS — F32A Depression, unspecified: Secondary | ICD-10-CM

## 2022-09-12 DIAGNOSIS — Z0000 Encounter for general adult medical examination without abnormal findings: Secondary | ICD-10-CM

## 2022-09-12 NOTE — Patient Instructions (Addendum)
It was great to see you!  Trial Mobic as needed for severe pain Consider tumeric 500 mg daily for joint inflammation  Order placed for your mammogram  Take care,  Lelon Mast
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
## 2022-09-14 ENCOUNTER — Encounter: Payer: Self-pay | Admitting: Physical Therapy

## 2022-09-14 ENCOUNTER — Ambulatory Visit: Payer: 59 | Admitting: Physical Therapy

## 2022-09-14 DIAGNOSIS — M542 Cervicalgia: Secondary | ICD-10-CM | POA: Diagnosis not present

## 2022-09-14 DIAGNOSIS — R293 Abnormal posture: Secondary | ICD-10-CM

## 2022-09-14 NOTE — Patient Instructions (Signed)

## 2022-09-19 ENCOUNTER — Telehealth: Payer: Self-pay | Admitting: Neurology

## 2022-09-19 NOTE — Telephone Encounter (Signed)
09/12/22 LVM [place] 09/06/22 UHC no auth req EE

## 2022-09-23 ENCOUNTER — Other Ambulatory Visit: Payer: Self-pay | Admitting: Physician Assistant
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
## 2022-09-27 ENCOUNTER — Encounter: Payer: Self-pay | Admitting: Physical Therapy

## 2022-09-27 ENCOUNTER — Ambulatory Visit: Payer: 59 | Attending: Student | Admitting: Physical Therapy

## 2022-09-27 DIAGNOSIS — R293 Abnormal posture: Secondary | ICD-10-CM | POA: Insufficient documentation

## 2022-09-27 DIAGNOSIS — M542 Cervicalgia: Secondary | ICD-10-CM | POA: Insufficient documentation
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
## 2022-09-29 ENCOUNTER — Encounter: Payer: Self-pay | Admitting: Physical Therapy

## 2022-09-29 ENCOUNTER — Ambulatory Visit: Payer: 59 | Admitting: Physical Therapy

## 2022-09-29 DIAGNOSIS — M542 Cervicalgia: Secondary | ICD-10-CM

## 2022-09-29 DIAGNOSIS — R293 Abnormal posture: Secondary | ICD-10-CM
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
## 2022-10-04 ENCOUNTER — Encounter: Payer: Self-pay | Admitting: Physical Therapy

## 2022-10-04 ENCOUNTER — Ambulatory Visit: Payer: 59 | Admitting: Physical Therapy

## 2022-10-04 DIAGNOSIS — M542 Cervicalgia: Secondary | ICD-10-CM | POA: Diagnosis not present

## 2022-10-04 DIAGNOSIS — R293 Abnormal posture: Secondary | ICD-10-CM

## 2022-10-04 NOTE — Patient Instructions (Addendum)
TENS stands for Transcutaneous Electrical Nerve Stimulation. In other words, electrical impulses are allowed to pass through the skin in order to excite a nerve.   Purpose and Use of TENS:  TENS is a method used to manage acute and chronic pain without the use of drugs. It has been effective in managing pain associated with surgery, sprains, strains, trauma, rheumatoid arthritis, and neuralgias. It is a non-addictive, low risk, and non-invasive technique used to control pain. It is not, by any means, a curative form of treatment.   How TENS Works:  Most TENS units are a Statistician unit powered by one 9 volt battery. Attached to the outside of the unit are two lead wires where two pins and/or snaps connect on each wire. All units come with a set of four reusable pads or electrodes. These are placed on the skin surrounding the area involved. By inserting the leads into  the pads, the electricity can pass from the unit making the circuit complete.  As the intensity is turned up slowly, the electrical current enters the body from the electrodes through the skin to the surrounding nerve fibers. This triggers the release of hormones from within the body. These hormones contain pain relievers. By increasing the circulation of these hormones, the person's pain may be lessened. It is also believed that the electrical stimulation itself helps to block the pain messages being sent to the brain, thus also decreasing the body's perception of pain.   Hazards:  TENS units are NOT to be used by patients with PACEMAKERS, DEFIBRILLATORS, DIABETIC PUMPS, PREGNANT WOMEN, and patients with SEIZURE DISORDERS.  TENS units are NOT to be used over the heart, throat, brain, or spinal cord.  One of the major side effects from the TENS unit may be skin irritation. Some people may develop a rash if they are sensitive to the materials used in the electrodes or the connecting wires.   Wear the unit for 10-15 min.   Avoid  overuse due the body getting used to the stem making it not as effective over time.   TENS UNIT  This is helpful for muscle pain and spasm.   Search and Purchase a TENS 7000 2nd edition at www.tenspros.com or www.amazon.com  (It should be less than $30)     TENS unit instructions:  Do not shower or bathe with the unit on Turn the unit off before removing electrodes or batteries If the electrodes lose stickiness add a drop of water to the electrodes after they are disconnected from the unit and place on plastic sheet. If you continued to have difficulty, call the TENS unit company to purchase more electrodes. Do not apply lotion on the skin area prior to use. Make sure the skin is clean and dry as this will help prolong the life of the electrodes. After use, always check skin for unusual red areas, rash or other skin difficulties. If there are any skin problems, does not apply electrodes to the same area. Never remove the electrodes from the unit by pulling the wires. Do not use the TENS unit or electrodes other than as directed. Do not change electrode placement without consulting your therapist or physician. Keep 2 fingers with between each electrode.
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
## 2022-10-05 ENCOUNTER — Ambulatory Visit: Payer: 59 | Admitting: Physical Therapy

## 2022-10-05 ENCOUNTER — Encounter: Payer: Self-pay | Admitting: Physical Therapy

## 2022-10-05 DIAGNOSIS — M542 Cervicalgia: Secondary | ICD-10-CM

## 2022-10-05 DIAGNOSIS — R293 Abnormal posture: Secondary | ICD-10-CM

## 2022-10-06 ENCOUNTER — Ambulatory Visit: Payer: 59 | Admitting: Physical Therapy

## 2022-11-07 ENCOUNTER — Encounter (HOSPITAL_BASED_OUTPATIENT_CLINIC_OR_DEPARTMENT_OTHER): Payer: Self-pay | Admitting: Student

## 2022-11-07 ENCOUNTER — Ambulatory Visit (HOSPITAL_BASED_OUTPATIENT_CLINIC_OR_DEPARTMENT_OTHER): Payer: 59 | Admitting: Student

## 2022-11-07 ENCOUNTER — Other Ambulatory Visit (HOSPITAL_BASED_OUTPATIENT_CLINIC_OR_DEPARTMENT_OTHER): Payer: Self-pay

## 2022-11-07 ENCOUNTER — Ambulatory Visit (HOSPITAL_BASED_OUTPATIENT_CLINIC_OR_DEPARTMENT_OTHER)
Admission: RE | Admit: 2022-11-07 | Discharge: 2022-11-07 | Disposition: A | Discharge status: Home / Self Care | Payer: 59 | Source: Ambulatory Visit | Attending: Student | Admitting: Student

## 2022-11-07 DIAGNOSIS — M75101 Unspecified rotator cuff tear or rupture of right shoulder, not specified as traumatic: Secondary | ICD-10-CM | POA: Diagnosis not present

## 2022-11-07 DIAGNOSIS — M79601 Pain in right arm: Secondary | ICD-10-CM | POA: Diagnosis present

## 2022-11-07 DIAGNOSIS — M25511 Pain in right shoulder: Secondary | ICD-10-CM | POA: Diagnosis not present

## 2022-11-07 MED ORDER — LIDOCAINE HCL 1 % IJ SOLN
4.0000 mL | INTRAMUSCULAR | Status: AC | PRN
Start: 2022-11-07 — End: 2022-11-07
  Administered 2022-11-07: 4 mL

## 2022-11-07 MED ORDER — TRIAMCINOLONE ACETONIDE 40 MG/ML IJ SUSP
2.0000 mL | INTRAMUSCULAR | Status: AC | PRN
Start: 2022-11-07 — End: 2022-11-07
  Administered 2022-11-07: 2 mL via INTRA_ARTICULAR
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
## 2022-12-12 ENCOUNTER — Ambulatory Visit: Payer: 59 | Admitting: Neurology

## 2022-12-12 DIAGNOSIS — G4710 Hypersomnia, unspecified: Secondary | ICD-10-CM | POA: Diagnosis not present

## 2022-12-12 DIAGNOSIS — F901 Attention-deficit hyperactivity disorder, predominantly hyperactive type: Secondary | ICD-10-CM

## 2022-12-12 DIAGNOSIS — F5104 Psychophysiologic insomnia: Secondary | ICD-10-CM

## 2022-12-12 DIAGNOSIS — R519 Headache, unspecified: Secondary | ICD-10-CM

## 2022-12-12 DIAGNOSIS — G4711 Idiopathic hypersomnia with long sleep time: Secondary | ICD-10-CM

## 2022-12-27 NOTE — Progress Notes (Signed)
[facility] Sandra Mccarthy "Sherri" 62 year old female 1960/08/24   HOME SLEEP TEST REPORT ( by Watch PAT)   STUDY DATE:  12-28-2022, mail out test data   ORDERING CLINICIAN: Melvyn Novas, MD  REFERRING CLINICIAN: PCP   CLINICAL INFORMATION/HISTORY: SHAKEELA BURROS is a 62 y.o. female patient who is seen upon referral on 09/06/2022 from [place] [facility].     The patient has ADHD , anxiety and is followed by [facility] for Memory loss.  Chief concern according to patient :  " I have always been a night owl and never been a morning person, I have trouble to initiate sleep all my adult life,  but now waking up in the night is more frequent" . More recently I had spells of severe sleepiness, struggling to stay awake. Its like an irrestible urge to go to sleep. I have  vivid dreams. I wake up disoriented sometimes".  She reports delayed sleep onset but now complicated by increasingly more fragmented sleep, non -restorative sleep, and some headaches, escalating over the last 10 years.   I have the pleasure of seeing MIKEYA CELONA 09/06/22 a right-handed female with a longstanding insomnia - sleep disorder. Sleep relevant medical history: Sleep walking in childhood, ADHD, no Tonsillectomy, had wisdom teeth all removed. , cervical spine injury from exercise - strained. DDD. MCI. Seasonal rhinitis. Family medical /sleep history: Mother on CPAP with OSA, one bother with and several of her children have insomnia. father died of lung cancer, he was chain smoker, COPD.      Epworth sleepiness score: 16/ 24 points   FSS endorsed at 55/ 63 points Depression score ; 5/ 15 while on meds. Marland Kitchen    BMI:  24 kg/m   Neck Circumference: 14''   FINDINGS:   Sleep Summary:   Total Recording Time (hours, min): 8 hours 51 minutes      Total Sleep Time (hours, min):   7 hours 21 minutes              Percent REM (%):   16%       There were 67 minutes of wakefulness after sleep  onset.                                Respiratory Indices:   Calculated pAHI (per hour):    By AASM criteria 6.6/h                         REM pAHI:     3.5/h                                            NREM pAHI: 7.2/h                            Supine AHI:   7.5/h versus a prone AHI of 2.8/h and a lateral sleep AHI of 3.5/h.  Overall nonsupine sleep was seen for 96 minutes and associated with 3.1 apneas and hypopneas per hour of sleep.    Mean volume of snoring was 40 dB and is at threshold.  Oxygen Saturation Statistics:   Oxygen Saturation (%) Mean:     95%                 O2 Saturation Range (%):    Between the nadir at 82% with a maximum saturation of 99%                                   O2 Saturation (minutes) <89%:    0 minutes       Pulse Rate Statistics:   Pulse Mean (bpm):    59 bpm             Pulse Range:     Between 45 and 88 bpm            IMPRESSION:  This HST indicates an extremely mild sleep apnea to be present which is not REM sleep dependent, not associated with hypoxemia, and strongly positional dependent.  No central events were recorded.  Minimal snoring -if any was present.     RECOMMENDATION: Avoiding supine sleep.  An intervention by CPAP or dental device is not necessary    INTERPRETING PHYSICIAN:   Melvyn Novas, MD  [facility] Sleep Board certified by The [employer] of Sleep Medicine and Diplomate of the [employer] of Sleep Medicine. Board certified In Neurology through the ABPN, Fellow of the [employer] of Neurology.

## 2023-01-03 NOTE — Procedures (Signed)
[facility] Sandra Mccarthy "Sherri" 62 year old female 04/24/1960   HOME SLEEP TEST REPORT ( by Watch PAT)   STUDY DATE:  12-28-2022, mail out test data   ORDERING CLINICIAN: Melvyn Novas, MD  REFERRING CLINICIAN: PCP   CLINICAL INFORMATION/HISTORY: Sandra Mccarthy is a 62 y.o. female patient who is seen upon referral on 09/06/2022 from [place] [facility].     The patient has ADHD , anxiety and is followed by [facility] for Memory loss.  Chief concern according to patient :  " I have always been a night owl and never been a morning person, I have trouble to initiate sleep all my adult life,  but now waking up in the night is more frequent" . More recently I had spells of severe sleepiness, struggling to stay awake. Its like an irrestible urge to go to sleep. I have  vivid dreams. I wake up disoriented sometimes".  She reports delayed sleep onset but now complicated by increasingly more fragmented sleep, non -restorative sleep, and some headaches, escalating over the last 10 years.   I have the pleasure of seeing Sandra Mccarthy 09/06/22 a right-handed female with a longstanding insomnia - sleep disorder. Sleep relevant medical history: Sleep walking in childhood, ADHD, no Tonsillectomy, had wisdom teeth all removed. , cervical spine injury from exercise - strained. DDD. MCI. Seasonal rhinitis. Family medical /sleep history: Mother on CPAP with OSA, one bother with and several of her children have insomnia. father died of lung cancer, he was chain smoker, COPD.      Epworth sleepiness score: 16/ 24 points   FSS endorsed at 55/ 63 points Depression score ; 5/ 15 while on meds. Marland Kitchen    BMI:  24 kg/m   Neck Circumference: 14''   FINDINGS:   Sleep Summary:   Total Recording Time (hours, min): 8 hours 51 minutes      Total Sleep Time (hours, min):   7 hours 21 minutes              Percent REM (%):   16%       There were 67 minutes of wakefulness after sleep  onset.                                Respiratory Indices:   Calculated pAHI (per hour):    By AASM criteria 6.6/h                         REM pAHI:     3.5/h                                            NREM pAHI: 7.2/h                            Supine AHI:   7.5/h versus a prone AHI of 2.8/h and a lateral sleep AHI of 3.5/h.  Overall nonsupine sleep was seen for 96 minutes and associated with 3.1 apneas and hypopneas per hour of sleep.    Mean volume of snoring was 40 dB and is at threshold.  Oxygen Saturation Statistics:   Oxygen Saturation (%) Mean:     95%                 O2 Saturation Range (%):    Between the nadir at 82% with a maximum saturation of 99%                                   O2 Saturation (minutes) <89%:    0 minutes       Pulse Rate Statistics:   Pulse Mean (bpm):    59 bpm             Pulse Range:     Between 45 and 88 bpm            IMPRESSION:  This HST indicates an extremely mild sleep apnea to be present which is not REM sleep dependent, not associated with hypoxemia, and strongly positional dependent.  No central events were recorded.  Minimal snoring -if any was present.     RECOMMENDATION: Avoiding supine sleep.  An intervention by CPAP or dental device is not necessary    INTERPRETING PHYSICIAN:   Melvyn Novas, MD  [facility] Sleep Board certified by The [employer] of Sleep Medicine and Diplomate of the [employer] of Sleep Medicine. Board certified In Neurology through the ABPN, Fellow of the [employer] of Neurology.

## 2023-01-04 ENCOUNTER — Encounter: Payer: Self-pay | Admitting: Neurology

## 2023-01-08 ENCOUNTER — Telehealth: Payer: Self-pay | Admitting: Neurology

## 2023-01-08 NOTE — Telephone Encounter (Signed)
Pt returning call. States no details were left on message. Requesting call back

## 2023-01-08 NOTE — Telephone Encounter (Signed)
Sent pt updated mychart message explaining the reason for the call.  **If the pt calls back please let her know we were calling to review the HST results which pt read on mychart.

## 2023-01-23 ENCOUNTER — Ambulatory Visit: Payer: 59 | Admitting: Family Medicine

## 2023-01-23 ENCOUNTER — Other Ambulatory Visit: Payer: Self-pay | Admitting: Physician Assistant

## 2023-01-23 ENCOUNTER — Encounter: Payer: Self-pay | Admitting: Family Medicine

## 2023-01-23 VITALS — BP 90/70 | HR 72 | Temp 97.2°F | Ht 66.0 in | Wt 144.0 lb

## 2023-01-23 DIAGNOSIS — J020 Streptococcal pharyngitis: Secondary | ICD-10-CM | POA: Diagnosis not present

## 2023-01-23 DIAGNOSIS — J029 Acute pharyngitis, unspecified: Secondary | ICD-10-CM

## 2023-01-23 LAB — POCT RAPID STREP A (OFFICE): Rapid Strep A Screen: POSITIVE — AB

## 2023-01-23 LAB — POC COVID19 BINAXNOW: SARS Coronavirus 2 Ag: NEGATIVE

## 2023-01-23 LAB — POCT INFLUENZA A/B
Influenza A, POC: NEGATIVE
Influenza B, POC: NEGATIVE

## 2023-01-23 MED ORDER — AMOXICILLIN 500 MG PO CAPS: 500.0000 mg | ORAL_CAPSULE | Freq: Two times a day (BID) | ORAL | 0 refills | Status: AC

## 2023-01-23 NOTE — Patient Instructions (Addendum)
 Illness since Thursday and strep is positive today - we opted to treat with amoxicillin  for 10 days- needs to take full course- take with food if possible. If not feeling better within 48 hours or if symptoms worsen return to see us .    Recommended follow up: Return for as needed for new, worsening, persistent symptoms.

## 2023-01-23 NOTE — Progress Notes (Signed)
 Phone [phone] In person visit   Subjective:   Sandra Mccarthy is a 63 y.o. year old very pleasant female patient who presents for/with See problem oriented charting Chief Complaint  Patient presents with   Sore Throat    Pt c/o sore throat, body aches, sneezing and weakness.  She has taken thera flu, zinc. Denies fever.    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   Knee pain 01/29/2017   Calyceal diverticulum of kidney 01/20/2017   Mixed stress and urge urinary incontinence 01/20/2017   Adjustment reaction with anxiety and depression 01/20/2017   Chronic cervical pain 01/20/2017   Chronic lumbar pain 01/20/2017   Arthritis 04/24/2016   Migraines 04/24/2016    Medications- reviewed and updated Current Outpatient Medications  Medication Sig Dispense Refill   acyclovir  (ZOVIRAX ) 400 MG tablet Take 400 mg po 3 times daily for 5 to 10 days until lesion resolved 60 tablet 1   amoxicillin  (AMOXIL ) 500 MG capsule Take 1 capsule (500 mg total) by mouth 2 (two) times daily for 10 days. 20 capsule 0   buPROPion  (WELLBUTRIN  XL) 150 MG 24 hr tablet TAKE 1 TABLET(150 MG) BY MOUTH DAILY 30 tablet 1   escitalopram  (LEXAPRO ) 20 MG tablet TAKE 1 TABLET(20 MG) BY MOUTH DAILY 90 tablet 0   fluticasone  (FLONASE ) 50 MCG/ACT nasal spray Place 2 sprays into both nostrils daily. 16 g 2   loratadine (CLARITIN) 10 MG tablet Take 10 mg by mouth daily.     No current facility-administered medications for this visit.     Objective:  BP 90/70   Pulse 72   Temp (!) 97.2 F (36.2 C)    Ht 5' 6 (1.676 m)   Wt 144 lb (65.3 kg)   LMP 03/24/2016 Comment:  tubal ligation  SpO2 97%   BMI 23.24 kg/m  Gen: NAD, resting comfortably Tympanic membrane is normal.  Pharynx erythematous but no substantial tonsillar edema or exudate.  Rest of the oral cavity normal. Cervical lymphadenopathy less than 1 cm noted x 2 CV: RRR no murmurs rubs or gallops Lungs: CTAB no crackles, wheeze, rhonchi Ext: no edema Skin: warm, dry   Results for orders placed or performed in visit on 01/23/23 (from the past 24 hours)  POC COVID-19     Status: None   Collection Time: 01/23/23 10:37 AM  Result Value Ref Range   SARS Coronavirus 2 Ag Negative Negative  POCT Influenza A/B     Status: None   Collection Time: 01/23/23 10:37 AM  Result Value Ref Range   Influenza A, POC Negative Negative   Influenza B, POC Negative Negative  POCT rapid strep A     Status: Abnormal   Collection Time: 01/23/23 10:37 AM  Result Value Ref Range   Rapid Strep A Screen Positive (A) Negative       Assessment and Plan   # Sore throat/body aches/sneezing and weakness S: Patient reports being sick since Thursday- started with scratchy throat. Felt weak and run down by Friday- also some cough/headache. Throat was scratchy but really got sore yesterday. Also with diffuse body aches.   She has  tried TheraFlu and zinc.  No fever reported but did get elevated temperature into 99s area yesterday. No chest pain or shortness of breath  - no known sick contacts - recently had root canal not on antibiotic -also working on some orthopedic issues A/P: Illness since Thursday and strep is positive today with flu and COVID-negative-symptomatology to me for tomorrow viral pattern and throat exam is not overly impressive-we discussed false positive risk but ultimately we opted to treat with amoxicillin  for 10 days- needs to take full course- take with food if possible. If not feeling better within 48 hours or if symptoms worsen return  to see us .   -We discussed penicillin but dosing likely challenging for her.  She would prefer Bicillin but we cannot give an estimated cost so we ultimately opted for amoxicillin  for convenience with twice daily dosing   Recommended follow up: Return for as needed for new, worsening, persistent symptoms.  Lab/Order associations:   ICD-10-CM   1. Strep throat  J02.0     2. Sore throat  J02.9 POC COVID-19    POCT Influenza A/B    POCT rapid strep A      Meds ordered this encounter  Medications   amoxicillin  (AMOXIL ) 500 MG capsule    Sig: Take 1 capsule (500 mg total) by mouth 2 (two) times daily for 10 days.    Dispense:  20 capsule    Refill:  0    Return precautions advised.  Garnette Lukes, MD

## 2023-01-24 ENCOUNTER — Other Ambulatory Visit: Payer: Self-pay | Admitting: Physician Assistant

## 2023-01-28 ENCOUNTER — Encounter (HOSPITAL_BASED_OUTPATIENT_CLINIC_OR_DEPARTMENT_OTHER): Payer: Self-pay

## 2023-01-29 ENCOUNTER — Other Ambulatory Visit (HOSPITAL_BASED_OUTPATIENT_CLINIC_OR_DEPARTMENT_OTHER): Payer: Self-pay

## 2023-01-29 ENCOUNTER — Ambulatory Visit (HOSPITAL_BASED_OUTPATIENT_CLINIC_OR_DEPARTMENT_OTHER): Payer: 59 | Admitting: Student

## 2023-01-29 DIAGNOSIS — S42402A Unspecified fracture of lower end of left humerus, initial encounter for closed fracture: Secondary | ICD-10-CM | POA: Diagnosis not present

## 2023-01-29 MED ORDER — TRAMADOL HCL 50 MG PO TABS
50.0000 mg | ORAL_TABLET | Freq: Four times a day (QID) | ORAL | 0 refills | Status: AC | PRN
  Filled 2023-01-29: qty 20, 5d supply, fill #0
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
## 2023-02-02 ENCOUNTER — Ambulatory Visit (HOSPITAL_BASED_OUTPATIENT_CLINIC_OR_DEPARTMENT_OTHER)
Admission: RE | Admit: 2023-02-02 | Discharge: 2023-02-02 | Disposition: A | Discharge status: Home / Self Care | Payer: 59 | Source: Ambulatory Visit | Attending: Student | Admitting: Student

## 2023-02-02 DIAGNOSIS — S42402A Unspecified fracture of lower end of left humerus, initial encounter for closed fracture: Secondary | ICD-10-CM | POA: Insufficient documentation

## 2023-02-05 ENCOUNTER — Ambulatory Visit (HOSPITAL_BASED_OUTPATIENT_CLINIC_OR_DEPARTMENT_OTHER): Payer: 59 | Admitting: Student

## 2023-02-05 ENCOUNTER — Encounter (HOSPITAL_BASED_OUTPATIENT_CLINIC_OR_DEPARTMENT_OTHER): Payer: Self-pay | Admitting: Student

## 2023-02-05 DIAGNOSIS — S5002XA Contusion of left elbow, initial encounter: Secondary | ICD-10-CM | POA: Diagnosis not present
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
## 2023-02-12 ENCOUNTER — Encounter (HOSPITAL_BASED_OUTPATIENT_CLINIC_OR_DEPARTMENT_OTHER): Payer: Self-pay

## 2023-02-15 ENCOUNTER — Ambulatory Visit (HOSPITAL_BASED_OUTPATIENT_CLINIC_OR_DEPARTMENT_OTHER): Payer: 59 | Admitting: Student

## 2023-02-15 DIAGNOSIS — M542 Cervicalgia: Secondary | ICD-10-CM

## 2023-02-15 DIAGNOSIS — M75101 Unspecified rotator cuff tear or rupture of right shoulder, not specified as traumatic: Secondary | ICD-10-CM

## 2023-02-15 MED ORDER — TRIAMCINOLONE ACETONIDE 40 MG/ML IJ SUSP
2.0000 mL | INTRAMUSCULAR | Status: AC | PRN
  Administered 2023-02-15: 2 mL via INTRA_ARTICULAR

## 2023-02-15 MED ORDER — LIDOCAINE HCL 1 % IJ SOLN
4.0000 mL | INTRAMUSCULAR | Status: AC | PRN
  Administered 2023-02-15: 4 mL
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
## 2023-02-19 ENCOUNTER — Other Ambulatory Visit: Payer: Self-pay | Admitting: Physician Assistant

## 2023-03-27 ENCOUNTER — Ambulatory Visit: Admitting: Physician Assistant

## 2023-03-27 VITALS — BP 110/62 | HR 66 | Temp 98.4°F | Ht 66.0 in | Wt 146.2 lb

## 2023-03-27 DIAGNOSIS — R109 Unspecified abdominal pain: Secondary | ICD-10-CM

## 2023-03-27 DIAGNOSIS — R197 Diarrhea, unspecified: Secondary | ICD-10-CM | POA: Diagnosis not present

## 2023-03-27 DIAGNOSIS — R0981 Nasal congestion: Secondary | ICD-10-CM | POA: Diagnosis not present

## 2023-03-27 LAB — COMPREHENSIVE METABOLIC PANEL
ALT: 24 U/L (ref 0–35)
AST: 26 U/L (ref 0–37)
Albumin: 4.1 g/dL (ref 3.5–5.2)
Alkaline Phosphatase: 82 U/L (ref 39–117)
BUN: 20 mg/dL (ref 6–23)
CO2: 31 meq/L (ref 19–32)
Calcium: 9.6 mg/dL (ref 8.4–10.5)
Chloride: 103 meq/L (ref 96–112)
Creatinine, Ser: 0.61 mg/dL (ref 0.40–1.20)
GFR: 96.41 mL/min (ref >60.00)
Glucose, Bld: 89 mg/dL (ref 70–99)
Potassium: 3.8 meq/L (ref 3.5–5.1)
Sodium: 139 meq/L (ref 135–145)
Total Bilirubin: 0.3 mg/dL (ref 0.2–1.2)
Total Protein: 6.8 g/dL (ref 6.0–8.3)

## 2023-03-27 LAB — CBC WITH DIFFERENTIAL/PLATELET
Basophils Absolute: 0 10*3/uL (ref 0.0–0.1)
Basophils Relative: 0.3 % (ref 0.0–3.0)
Eosinophils Absolute: 0.1 10*3/uL (ref 0.0–0.7)
Eosinophils Relative: 1.2 % (ref 0.0–5.0)
HCT: 38.1 % (ref 36.0–46.0)
Hemoglobin: 13 g/dL (ref 12.0–15.0)
Lymphocytes Relative: 24.4 % (ref 12.0–46.0)
Lymphs Abs: 2 10*3/uL (ref 0.7–4.0)
MCHC: 34.1 g/dL (ref 30.0–36.0)
MCV: 96 fl (ref 78.0–100.0)
Monocytes Absolute: 0.5 10*3/uL (ref 0.1–1.0)
Monocytes Relative: 5.9 % (ref 3.0–12.0)
Neutro Abs: 5.7 10*3/uL (ref 1.4–7.7)
Neutrophils Relative %: 68.2 % (ref 43.0–77.0)
Platelets: 336 10*3/uL (ref 150.0–400.0)
RBC: 3.97 Mil/uL (ref 3.87–5.11)
RDW: 12.9 % (ref 11.5–15.5)
WBC: 8.3 10*3/uL (ref 4.0–10.5)

## 2023-03-27 MED ORDER — AZELASTINE HCL 0.1 % NA SOLN: 1.0000 | Freq: Two times a day (BID) | NASAL | 12 refills | Status: AC

## 2023-03-27 NOTE — Patient Instructions (Signed)
 It was great to see you!  I will be in touch with results  Use nasal spray to see if this can clear up your mucus  Keep me posted on symptom(s)   Take care,  [name] PA-C
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
## 2023-03-28 ENCOUNTER — Encounter: Payer: Self-pay | Admitting: Physician Assistant

## 2023-03-29 LAB — GI PROFILE, STOOL, PCR
Adenovirus F 40/41: NOT DETECTED
Astrovirus: NOT DETECTED
C difficile toxin A/B: NOT DETECTED
Campylobacter: NOT DETECTED
Cryptosporidium: NOT DETECTED
Cyclospora cayetanensis: NOT DETECTED
Entamoeba histolytica: NOT DETECTED
Enteroaggregative E coli: DETECTED — AB
Enteropathogenic E coli: DETECTED — AB
Enterotoxigenic E coli: NOT DETECTED
Giardia lamblia: NOT DETECTED
Norovirus GI/GII: DETECTED — AB
Plesiomonas shigelloides: NOT DETECTED
Rotavirus A: NOT DETECTED
Salmonella: NOT DETECTED
Sapovirus: NOT DETECTED
Shiga-toxin-producing E coli: NOT DETECTED
Shigella/Enteroinvasive E coli: NOT DETECTED
Vibrio cholerae: NOT DETECTED
Vibrio: NOT DETECTED
Yersinia enterocolitica: NOT DETECTED

## 2023-03-30 LAB — OVA AND PARASITE EXAMINATION
CONCENTRATE RESULT:: NONE SEEN
MICRO NUMBER:: 16186477
SPECIMEN QUALITY:: ADEQUATE
TRICHROME RESULT:: NONE SEEN

## 2023-04-17 ENCOUNTER — Other Ambulatory Visit: Payer: Self-pay

## 2023-04-17 ENCOUNTER — Ambulatory Visit: Attending: Student

## 2023-04-17 DIAGNOSIS — M542 Cervicalgia: Secondary | ICD-10-CM | POA: Insufficient documentation

## 2023-04-17 DIAGNOSIS — R252 Cramp and spasm: Secondary | ICD-10-CM | POA: Insufficient documentation

## 2023-04-17 DIAGNOSIS — M25511 Pain in right shoulder: Secondary | ICD-10-CM | POA: Insufficient documentation

## 2023-04-17 DIAGNOSIS — M6281 Muscle weakness (generalized): Secondary | ICD-10-CM | POA: Insufficient documentation

## 2023-04-17 DIAGNOSIS — M75101 Unspecified rotator cuff tear or rupture of right shoulder, not specified as traumatic: Secondary | ICD-10-CM | POA: Diagnosis not present

## 2023-04-17 DIAGNOSIS — R293 Abnormal posture: Secondary | ICD-10-CM | POA: Diagnosis present

## 2023-04-17 DIAGNOSIS — M25611 Stiffness of right shoulder, not elsewhere classified: Secondary | ICD-10-CM | POA: Diagnosis present
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
## 2023-04-18 ENCOUNTER — Encounter: Payer: Self-pay | Admitting: Physical Therapy

## 2023-04-18 ENCOUNTER — Ambulatory Visit: Admitting: Physical Therapy

## 2023-04-18 DIAGNOSIS — M25511 Pain in right shoulder: Secondary | ICD-10-CM

## 2023-04-18 DIAGNOSIS — R293 Abnormal posture: Secondary | ICD-10-CM

## 2023-04-18 DIAGNOSIS — R252 Cramp and spasm: Secondary | ICD-10-CM

## 2023-04-18 DIAGNOSIS — M25611 Stiffness of right shoulder, not elsewhere classified: Secondary | ICD-10-CM

## 2023-04-18 DIAGNOSIS — M542 Cervicalgia: Secondary | ICD-10-CM

## 2023-04-18 DIAGNOSIS — M6281 Muscle weakness (generalized): Secondary | ICD-10-CM

## 2023-04-23 ENCOUNTER — Ambulatory Visit: Admitting: Physical Therapy

## 2023-04-23 ENCOUNTER — Encounter: Payer: Self-pay | Admitting: Physical Therapy

## 2023-04-23 DIAGNOSIS — M25611 Stiffness of right shoulder, not elsewhere classified: Secondary | ICD-10-CM

## 2023-04-23 DIAGNOSIS — R252 Cramp and spasm: Secondary | ICD-10-CM

## 2023-04-23 DIAGNOSIS — M542 Cervicalgia: Secondary | ICD-10-CM

## 2023-04-23 DIAGNOSIS — M25511 Pain in right shoulder: Secondary | ICD-10-CM

## 2023-04-23 DIAGNOSIS — R293 Abnormal posture: Secondary | ICD-10-CM

## 2023-04-23 DIAGNOSIS — M6281 Muscle weakness (generalized): Secondary | ICD-10-CM
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
## 2023-04-27 ENCOUNTER — Ambulatory Visit
Admission: RE | Admit: 2023-04-27 | Discharge: 2023-04-27 | Disposition: A | Discharge status: Home / Self Care | Source: Ambulatory Visit | Attending: Student

## 2023-04-27 DIAGNOSIS — M75101 Unspecified rotator cuff tear or rupture of right shoulder, not specified as traumatic: Secondary | ICD-10-CM

## 2023-05-01 ENCOUNTER — Ambulatory Visit: Admitting: Physical Therapy

## 2023-05-02 ENCOUNTER — Ambulatory Visit

## 2023-05-07 ENCOUNTER — Ambulatory Visit (HOSPITAL_BASED_OUTPATIENT_CLINIC_OR_DEPARTMENT_OTHER): Admitting: Orthopaedic Surgery

## 2023-05-07 ENCOUNTER — Encounter (HOSPITAL_BASED_OUTPATIENT_CLINIC_OR_DEPARTMENT_OTHER): Payer: Self-pay | Admitting: Orthopaedic Surgery

## 2023-05-07 DIAGNOSIS — M75101 Unspecified rotator cuff tear or rupture of right shoulder, not specified as traumatic: Secondary | ICD-10-CM | POA: Diagnosis not present
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
## 2023-05-08 ENCOUNTER — Other Ambulatory Visit: Payer: Self-pay | Admitting: Orthopaedic Surgery

## 2023-05-08 ENCOUNTER — Ambulatory Visit: Admitting: Physical Therapy

## 2023-05-08 MED ORDER — METHOCARBAMOL 500 MG PO TABS: 500.0000 mg | ORAL_TABLET | Freq: Four times a day (QID) | ORAL | 1 refills | Status: AC

## 2023-05-10 ENCOUNTER — Encounter

## 2023-05-15 ENCOUNTER — Encounter: Admitting: Physical Therapy

## 2023-05-17 ENCOUNTER — Encounter

## 2023-05-22 ENCOUNTER — Encounter: Admitting: Physical Therapy

## 2023-05-24 ENCOUNTER — Encounter

## 2023-05-29 ENCOUNTER — Encounter: Admitting: Physical Therapy

## 2023-05-31 ENCOUNTER — Encounter

## 2023-06-05 ENCOUNTER — Encounter: Admitting: Physical Therapy

## 2023-06-07 ENCOUNTER — Encounter

## 2023-06-12 ENCOUNTER — Encounter

## 2023-07-18 ENCOUNTER — Ambulatory Visit (HOSPITAL_BASED_OUTPATIENT_CLINIC_OR_DEPARTMENT_OTHER): Admitting: Orthopaedic Surgery

## 2023-08-17 ENCOUNTER — Encounter (HOSPITAL_BASED_OUTPATIENT_CLINIC_OR_DEPARTMENT_OTHER): Payer: Self-pay

## 2023-08-17 ENCOUNTER — Ambulatory Visit (HOSPITAL_BASED_OUTPATIENT_CLINIC_OR_DEPARTMENT_OTHER): Payer: Self-pay | Admitting: Orthopaedic Surgery

## 2023-08-17 ENCOUNTER — Ambulatory Visit (HOSPITAL_BASED_OUTPATIENT_CLINIC_OR_DEPARTMENT_OTHER): Admitting: Orthopaedic Surgery

## 2023-08-17 DIAGNOSIS — M75101 Unspecified rotator cuff tear or rupture of right shoulder, not specified as traumatic: Secondary | ICD-10-CM
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
## 2023-08-24 ENCOUNTER — Telehealth: Payer: Self-pay | Admitting: *Deleted

## 2023-08-24 NOTE — Telephone Encounter (Signed)
 Copied from CRM #[id number]. Topic: General - Other >> Aug 23, 2023  5:14 PM [name] wrote: Reason for CRM: pt called and stated that she faxed over the pre-op form to the office and would like a callback to confirm if it was received.   Form placed in PCP office for reviewed  [facility]

## 2023-08-27 ENCOUNTER — Telehealth: Payer: Self-pay

## 2023-08-27 NOTE — Telephone Encounter (Signed)
 Copied from CRM [id number]. Topic: General - Other >> Aug 27, 2023  2:33 PM Sophia H wrote: Reason for CRM: Patient is checking in on a pre op form from ortho. Wants to know if provider was able to fill it out for her, please advise, can't schedule surgery till it's filled out & faxed back. Ty # [phone]  Attempted to call patient however unable to lvm; fax received placed in box for your review and signature.

## 2023-08-29 NOTE — Telephone Encounter (Signed)
 Please see msg from PCP and schedule patient accordingly

## 2023-08-31 ENCOUNTER — Ambulatory Visit: Payer: Self-pay | Admitting: Physician Assistant

## 2023-08-31 ENCOUNTER — Encounter: Payer: Self-pay | Admitting: Physician Assistant

## 2023-08-31 ENCOUNTER — Ambulatory Visit: Admitting: Physician Assistant

## 2023-08-31 VITALS — BP 101/70 | HR 73 | Temp 97.7°F | Ht 66.0 in | Wt 152.0 lb

## 2023-08-31 DIAGNOSIS — Z01818 Encounter for other preprocedural examination: Secondary | ICD-10-CM

## 2023-08-31 DIAGNOSIS — R051 Acute cough: Secondary | ICD-10-CM

## 2023-08-31 LAB — LIPID PANEL
Cholesterol: 195 mg/dL (ref 0–200)
HDL: 62.7 mg/dL (ref >39.00)
LDL Cholesterol: 112 mg/dL — ABNORMAL HIGH (ref 0–99)
NonHDL: 132.4
Total CHOL/HDL Ratio: 3
Triglycerides: 100 mg/dL (ref 0.0–149.0)
VLDL: 20 mg/dL (ref 0.0–40.0)

## 2023-08-31 LAB — COMPREHENSIVE METABOLIC PANEL WITH GFR
ALT: 13 U/L (ref 0–35)
AST: 20 U/L (ref 0–37)
Albumin: 4.2 g/dL (ref 3.5–5.2)
Alkaline Phosphatase: 78 U/L (ref 39–117)
BUN: 15 mg/dL (ref 6–23)
CO2: 29 meq/L (ref 19–32)
Calcium: 9.4 mg/dL (ref 8.4–10.5)
Chloride: 103 meq/L (ref 96–112)
Creatinine, Ser: 0.71 mg/dL (ref 0.40–1.20)
GFR: 91.41 mL/min (ref >60.00)
Glucose, Bld: 97 mg/dL (ref 70–99)
Potassium: 4.2 meq/L (ref 3.5–5.1)
Sodium: 139 meq/L (ref 135–145)
Total Bilirubin: 0.4 mg/dL (ref 0.2–1.2)
Total Protein: 7 g/dL (ref 6.0–8.3)

## 2023-08-31 LAB — CBC WITH DIFFERENTIAL/PLATELET
Basophils Absolute: 0 K/uL (ref 0.0–0.1)
Basophils Relative: 0.6 % (ref 0.0–3.0)
Eosinophils Absolute: 0.2 K/uL (ref 0.0–0.7)
Eosinophils Relative: 2.7 % (ref 0.0–5.0)
HCT: 38.4 % (ref 36.0–46.0)
Hemoglobin: 13 g/dL (ref 12.0–15.0)
Lymphocytes Relative: 31.5 % (ref 12.0–46.0)
Lymphs Abs: 2 K/uL (ref 0.7–4.0)
MCHC: 33.8 g/dL (ref 30.0–36.0)
MCV: 92.9 fl (ref 78.0–100.0)
Monocytes Absolute: 0.5 K/uL (ref 0.1–1.0)
Monocytes Relative: 8.1 % (ref 3.0–12.0)
Neutro Abs: 3.7 K/uL (ref 1.4–7.7)
Neutrophils Relative %: 57.1 % (ref 43.0–77.0)
Platelets: 289 K/uL (ref 150.0–400.0)
RBC: 4.13 Mil/uL (ref 3.87–5.11)
RDW: 12.6 % (ref 11.5–15.5)
WBC: 6.4 K/uL (ref 4.0–10.5)

## 2023-08-31 LAB — HEMOGLOBIN A1C: Hgb A1c MFr Bld: 5.6 % (ref 4.6–6.5)

## 2023-08-31 MED ORDER — MELOXICAM 15 MG PO TABS: 15.0000 mg | ORAL_TABLET | Freq: Every day | ORAL | 1 refills | Status: AC
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
## 2023-09-07 ENCOUNTER — Telehealth (HOSPITAL_BASED_OUTPATIENT_CLINIC_OR_DEPARTMENT_OTHER): Payer: Self-pay | Admitting: Orthopaedic Surgery

## 2023-09-07 NOTE — Telephone Encounter (Signed)
 Patient wants to know when she will get a surgery date. She states she has called April and gotten clarence from her primary DR. She said she has had to put her entire life on hold waiting on this date. Please advise

## 2023-09-10 NOTE — Telephone Encounter (Signed)
 I spoke with the patient on 09/07/23 and scheduled her for surgery on 09/27/23.

## 2023-09-26 ENCOUNTER — Telehealth: Payer: Self-pay | Admitting: Orthopaedic Surgery

## 2023-09-26 NOTE — Telephone Encounter (Signed)
 Patient called and wanted to know how much do she have to pay for this ice machine for her shoulder. Her insurance said that if she purchase it and they will pay 80%. She also said that her insurance said she needs to get codes for her surgery. CB#[phone]

## 2023-09-27 ENCOUNTER — Other Ambulatory Visit (HOSPITAL_BASED_OUTPATIENT_CLINIC_OR_DEPARTMENT_OTHER): Payer: Self-pay | Admitting: Orthopaedic Surgery

## 2023-09-27 ENCOUNTER — Encounter (HOSPITAL_BASED_OUTPATIENT_CLINIC_OR_DEPARTMENT_OTHER): Payer: Self-pay | Admitting: Orthopaedic Surgery

## 2023-09-27 ENCOUNTER — Other Ambulatory Visit (HOSPITAL_BASED_OUTPATIENT_CLINIC_OR_DEPARTMENT_OTHER): Payer: Self-pay

## 2023-09-27 DIAGNOSIS — M75101 Unspecified rotator cuff tear or rupture of right shoulder, not specified as traumatic: Secondary | ICD-10-CM | POA: Diagnosis not present

## 2023-09-27 MED ORDER — ACETAMINOPHEN 500 MG PO TABS: 500.0000 mg | ORAL_TABLET | Freq: Three times a day (TID) | ORAL | 0 refills | Status: AC

## 2023-09-27 MED ORDER — OXYCODONE HCL 5 MG PO TABS
5.0000 mg | ORAL_TABLET | ORAL | 0 refills | Status: AC | PRN
  Filled 2023-09-27: qty 10, 2d supply, fill #0

## 2023-09-27 MED ORDER — OXYCODONE HCL 5 MG PO TABS: 5.0000 mg | ORAL_TABLET | ORAL | 0 refills | Status: AC | PRN

## 2023-09-27 MED ORDER — IBUPROFEN 800 MG PO TABS
800.0000 mg | ORAL_TABLET | Freq: Three times a day (TID) | ORAL | 0 refills | Status: AC
Start: 2023-09-27 — End: 2023-10-07
  Filled 2023-09-27: qty 30, 10d supply, fill #0

## 2023-09-27 MED ORDER — ASPIRIN 325 MG PO TBEC: 325.0000 mg | DELAYED_RELEASE_TABLET | Freq: Every day | ORAL | 0 refills | Status: AC

## 2023-09-27 MED ORDER — ACETAMINOPHEN 500 MG PO TABS
500.0000 mg | ORAL_TABLET | Freq: Three times a day (TID) | ORAL | 0 refills | Status: AC
  Filled 2023-09-27: qty 30, 10d supply, fill #0

## 2023-09-27 MED ORDER — IBUPROFEN 800 MG PO TABS: 800.0000 mg | ORAL_TABLET | Freq: Three times a day (TID) | ORAL | 0 refills | Status: AC

## 2023-09-27 MED ORDER — ASPIRIN 325 MG PO TBEC
325.0000 mg | DELAYED_RELEASE_TABLET | Freq: Every day | ORAL | 0 refills | Status: AC
  Filled 2023-09-27: qty 14, 14d supply, fill #0

## 2023-09-27 NOTE — Progress Notes (Signed)
 Date of Surgery: 09/27/2023  INDICATIONS: Sandra Mccarthy is a 63 y.o.-year-old female with massive right shoulder rotator cuff tear.  The risk and benefits of the procedure were discussed in detail and documented in the pre-operative evaluation.   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  POSTOPERATIVE DIAGNOSIS: Same.  PROCEDURE: Arthroscopic limited debridement - 70177 Arthroscopic subacromial decompression - 70173 Arthroscopic rotator cuff repair - 70172 Arthroscopic biceps tenodesis - 70171  SURGEON: Sandra LITTIE Parker MD  ASSISTANT: Conley Funk, ATC  ANESTHESIA:  general  IV FLUIDS AND URINE: See anesthesia record.  ANTIBIOTICS: Ancef  ESTIMATED BLOOD LOSS: 5 mL.  IMPLANTS:  * No surgical log found *  DRAINS: None  CULTURES: None  COMPLICATIONS: none  PROCEDURE:    OPERATIVE FINDING: Exam under anesthesia:   Examination under anesthesia revealed forward elevation of 150 degrees.  With the arm at the side, there was 65 degrees of external rotation.  There is a 1+ anterior load shift and a 1+ posterior load shift.    Arthroscopic findings demonstrated: Articular space: Rotator interval injection redness Chondral surfaces: Normal Biceps: Frayed Subscapularis: Intact Supraspinatus: Torn with retraction Infraspinatus: Torn with retraction    I identified the patient in the pre-operative holding area.  I marked the operative right shoulder with my initials. I reviewed the risks and benefits of the proposed surgical intervention and the patient wished to proceed.  Anesthesia was then performed with regional block.  The patient was transferred to the operative suite and placed in the beach chair position with all bony prominences padded.     SCDs were placed on bilateral lower extremity. Appropriate antibiotics was administered within 1 hour before incision.  Anesthesia was induced.  The operative extremity was then  prepped and draped in standard fashion. A time out was performed confirming the correct extremity, correct patient and correct procedure.   The arthroscope was introduced in the glenohumeral joint from a posterior portal.  An anterior portal was created.  The shoulder was examined and the above findings were noted.     With an arthroscopic shaver and a wand ablator, synovitis throughout the  shoulder was resected.  The arthroscopic shaver was used to excise torn portions of the labrum back to a stable margin. Specifically this was done for the anterior and superior labrum. The rotator interval was debrided using shaver and electrocautery.  At this time the biceps was tagged with a self passing device with a #2 nonabsorbable suture twice.  This was ultimately placed into the anterior lateral row anchorThrough visualization from intra-articular, the footprint of the rotator cuff was debrided of soft tissues back to bleeding bone.  This was done with an arthroscopic shaver.     The rotator cuff was then approached through the subacromial space. Anterior, lateral, and posterior portals were used.  Bursectomy was performed with an arthroscopic shaver and [employer] wand.  The soft tissues and 1 mm of bone on the undersurface of the acromion and clavicle were resected with the arthroscopic shaver and wand. There was good excursion that was noted of the tendon back to its footprint which would be amendable to an repair.   The rotator cuff was repaired with a double row configuration with two medial row all suture suture anchors as noted above with sutures passed from anterior to posterior in horizontal fashion with a self passing suture device.  The posterior 2 sutures were tied with knots in order to ultimately reapproximate the tendon  to its anatomic footprint. A total of 8 limbs of suture were passed.  The sutures were placed with a single lateral row anchor.  This provided excellent anatomic footprint  approximation.   The shoulder was irrigated.  The arthroscopic instruments were removed.  Wounds were closed with 3-0 nylon sutures.  A sterile dressing was applied with xeroform, 4x8s, abdominal pad, and tape. An [name] was placed and the upper extremity was placed in a shoulder immobilizer.  The patient tolerated the procedure well and was taken to the recovery room in stable condition.  All counts were correct in the case. The patient tolerated the procedure well and was taken to the recovery room in stable condition.    POSTOPERATIVE PLAN: She will follow rotator cuff protocol.  She will be seen immediately by physical therapy.  She was placed on aspirin  for 2 weeks for blood clot prevention  Sandra LITTIE Parker, MD 5:08 PM

## 2023-09-29 ENCOUNTER — Ambulatory Visit (HOSPITAL_BASED_OUTPATIENT_CLINIC_OR_DEPARTMENT_OTHER): Attending: Orthopaedic Surgery | Admitting: Physical Therapy

## 2023-09-29 ENCOUNTER — Other Ambulatory Visit: Payer: Self-pay

## 2023-09-29 ENCOUNTER — Encounter (HOSPITAL_BASED_OUTPATIENT_CLINIC_OR_DEPARTMENT_OTHER): Payer: Self-pay | Admitting: Physical Therapy

## 2023-09-29 DIAGNOSIS — M6281 Muscle weakness (generalized): Secondary | ICD-10-CM | POA: Insufficient documentation

## 2023-09-29 DIAGNOSIS — R293 Abnormal posture: Secondary | ICD-10-CM | POA: Insufficient documentation

## 2023-09-29 DIAGNOSIS — M25611 Stiffness of right shoulder, not elsewhere classified: Secondary | ICD-10-CM | POA: Diagnosis present

## 2023-09-29 DIAGNOSIS — M25511 Pain in right shoulder: Secondary | ICD-10-CM | POA: Insufficient documentation

## 2023-09-29 DIAGNOSIS — M75101 Unspecified rotator cuff tear or rupture of right shoulder, not specified as traumatic: Secondary | ICD-10-CM | POA: Insufficient documentation
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
## 2023-10-03 ENCOUNTER — Ambulatory Visit (HOSPITAL_BASED_OUTPATIENT_CLINIC_OR_DEPARTMENT_OTHER)

## 2023-10-03 ENCOUNTER — Encounter (HOSPITAL_BASED_OUTPATIENT_CLINIC_OR_DEPARTMENT_OTHER): Payer: Self-pay

## 2023-10-03 DIAGNOSIS — M25511 Pain in right shoulder: Secondary | ICD-10-CM

## 2023-10-03 DIAGNOSIS — M6281 Muscle weakness (generalized): Secondary | ICD-10-CM

## 2023-10-03 DIAGNOSIS — R293 Abnormal posture: Secondary | ICD-10-CM

## 2023-10-03 DIAGNOSIS — M25611 Stiffness of right shoulder, not elsewhere classified: Secondary | ICD-10-CM
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
## 2023-10-08 ENCOUNTER — Other Ambulatory Visit (HOSPITAL_BASED_OUTPATIENT_CLINIC_OR_DEPARTMENT_OTHER): Payer: Self-pay

## 2023-10-08 ENCOUNTER — Encounter (HOSPITAL_BASED_OUTPATIENT_CLINIC_OR_DEPARTMENT_OTHER): Payer: Self-pay | Admitting: Physical Therapy

## 2023-10-08 ENCOUNTER — Ambulatory Visit (HOSPITAL_BASED_OUTPATIENT_CLINIC_OR_DEPARTMENT_OTHER): Admitting: Physical Therapy

## 2023-10-08 DIAGNOSIS — M25611 Stiffness of right shoulder, not elsewhere classified: Secondary | ICD-10-CM

## 2023-10-08 DIAGNOSIS — M25511 Pain in right shoulder: Secondary | ICD-10-CM

## 2023-10-08 DIAGNOSIS — M6281 Muscle weakness (generalized): Secondary | ICD-10-CM
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
## 2023-10-11 ENCOUNTER — Encounter (HOSPITAL_BASED_OUTPATIENT_CLINIC_OR_DEPARTMENT_OTHER): Admitting: Orthopaedic Surgery

## 2023-10-11 ENCOUNTER — Other Ambulatory Visit: Payer: Self-pay | Admitting: Physician Assistant

## 2023-10-12 ENCOUNTER — Encounter (HOSPITAL_BASED_OUTPATIENT_CLINIC_OR_DEPARTMENT_OTHER): Payer: Self-pay | Admitting: Orthopaedic Surgery

## 2023-10-12 ENCOUNTER — Ambulatory Visit (INDEPENDENT_AMBULATORY_CARE_PROVIDER_SITE_OTHER): Admitting: Student

## 2023-10-12 DIAGNOSIS — M75101 Unspecified rotator cuff tear or rupture of right shoulder, not specified as traumatic: Secondary | ICD-10-CM
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
## 2023-10-17 ENCOUNTER — Encounter (HOSPITAL_BASED_OUTPATIENT_CLINIC_OR_DEPARTMENT_OTHER): Payer: Self-pay | Admitting: Physical Therapy

## 2023-10-17 ENCOUNTER — Ambulatory Visit (HOSPITAL_BASED_OUTPATIENT_CLINIC_OR_DEPARTMENT_OTHER): Attending: Orthopaedic Surgery | Admitting: Physical Therapy

## 2023-10-17 DIAGNOSIS — M6281 Muscle weakness (generalized): Secondary | ICD-10-CM | POA: Diagnosis present

## 2023-10-17 DIAGNOSIS — M25511 Pain in right shoulder: Secondary | ICD-10-CM | POA: Diagnosis present

## 2023-10-17 DIAGNOSIS — M25611 Stiffness of right shoulder, not elsewhere classified: Secondary | ICD-10-CM | POA: Diagnosis present

## 2023-10-17 DIAGNOSIS — R293 Abnormal posture: Secondary | ICD-10-CM | POA: Insufficient documentation
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
## 2023-10-23 ENCOUNTER — Ambulatory Visit (HOSPITAL_BASED_OUTPATIENT_CLINIC_OR_DEPARTMENT_OTHER): Admitting: Physical Therapy

## 2023-10-23 ENCOUNTER — Encounter: Payer: Self-pay | Admitting: Physician Assistant

## 2023-10-23 ENCOUNTER — Encounter (HOSPITAL_BASED_OUTPATIENT_CLINIC_OR_DEPARTMENT_OTHER): Payer: Self-pay | Admitting: Physical Therapy

## 2023-10-23 DIAGNOSIS — M25511 Pain in right shoulder: Secondary | ICD-10-CM | POA: Diagnosis not present

## 2023-10-23 DIAGNOSIS — R293 Abnormal posture: Secondary | ICD-10-CM

## 2023-10-23 DIAGNOSIS — M25611 Stiffness of right shoulder, not elsewhere classified: Secondary | ICD-10-CM
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
## 2023-10-24 ENCOUNTER — Encounter (HOSPITAL_BASED_OUTPATIENT_CLINIC_OR_DEPARTMENT_OTHER): Payer: Self-pay

## 2023-10-24 ENCOUNTER — Ambulatory Visit (INDEPENDENT_AMBULATORY_CARE_PROVIDER_SITE_OTHER): Admitting: Orthopaedic Surgery

## 2023-10-24 DIAGNOSIS — M75101 Unspecified rotator cuff tear or rupture of right shoulder, not specified as traumatic: Secondary | ICD-10-CM
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
## 2023-10-25 ENCOUNTER — Ambulatory Visit (HOSPITAL_BASED_OUTPATIENT_CLINIC_OR_DEPARTMENT_OTHER): Admitting: Physical Therapy

## 2023-10-25 ENCOUNTER — Encounter (HOSPITAL_BASED_OUTPATIENT_CLINIC_OR_DEPARTMENT_OTHER): Payer: Self-pay | Admitting: Physical Therapy

## 2023-10-25 DIAGNOSIS — M6281 Muscle weakness (generalized): Secondary | ICD-10-CM

## 2023-10-25 DIAGNOSIS — M25611 Stiffness of right shoulder, not elsewhere classified: Secondary | ICD-10-CM

## 2023-10-25 DIAGNOSIS — R293 Abnormal posture: Secondary | ICD-10-CM

## 2023-10-25 DIAGNOSIS — M25511 Pain in right shoulder: Secondary | ICD-10-CM
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
## 2023-10-30 ENCOUNTER — Ambulatory Visit (HOSPITAL_BASED_OUTPATIENT_CLINIC_OR_DEPARTMENT_OTHER): Admitting: Physical Therapy

## 2023-10-30 ENCOUNTER — Encounter (HOSPITAL_BASED_OUTPATIENT_CLINIC_OR_DEPARTMENT_OTHER): Payer: Self-pay | Admitting: Physical Therapy

## 2023-10-30 DIAGNOSIS — M25611 Stiffness of right shoulder, not elsewhere classified: Secondary | ICD-10-CM

## 2023-10-30 DIAGNOSIS — M25511 Pain in right shoulder: Secondary | ICD-10-CM

## 2023-10-30 DIAGNOSIS — R293 Abnormal posture: Secondary | ICD-10-CM
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
## 2023-11-01 ENCOUNTER — Encounter (HOSPITAL_BASED_OUTPATIENT_CLINIC_OR_DEPARTMENT_OTHER): Payer: Self-pay | Admitting: Physical Therapy

## 2023-11-01 ENCOUNTER — Ambulatory Visit (HOSPITAL_BASED_OUTPATIENT_CLINIC_OR_DEPARTMENT_OTHER): Admitting: Physical Therapy

## 2023-11-01 DIAGNOSIS — M25511 Pain in right shoulder: Secondary | ICD-10-CM | POA: Diagnosis not present

## 2023-11-01 DIAGNOSIS — M25611 Stiffness of right shoulder, not elsewhere classified: Secondary | ICD-10-CM

## 2023-11-01 DIAGNOSIS — M6281 Muscle weakness (generalized): Secondary | ICD-10-CM

## 2023-11-01 DIAGNOSIS — R293 Abnormal posture: Secondary | ICD-10-CM
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
## 2023-11-06 ENCOUNTER — Encounter (HOSPITAL_BASED_OUTPATIENT_CLINIC_OR_DEPARTMENT_OTHER): Payer: Self-pay | Admitting: Physical Therapy

## 2023-11-06 ENCOUNTER — Ambulatory Visit (HOSPITAL_BASED_OUTPATIENT_CLINIC_OR_DEPARTMENT_OTHER): Admitting: Physical Therapy

## 2023-11-06 DIAGNOSIS — M25511 Pain in right shoulder: Secondary | ICD-10-CM

## 2023-11-06 DIAGNOSIS — R293 Abnormal posture: Secondary | ICD-10-CM

## 2023-11-06 DIAGNOSIS — M25611 Stiffness of right shoulder, not elsewhere classified: Secondary | ICD-10-CM

## 2023-11-06 DIAGNOSIS — M6281 Muscle weakness (generalized): Secondary | ICD-10-CM
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
## 2023-11-09 ENCOUNTER — Encounter (HOSPITAL_BASED_OUTPATIENT_CLINIC_OR_DEPARTMENT_OTHER): Payer: Self-pay | Admitting: Physical Therapy

## 2023-11-09 ENCOUNTER — Ambulatory Visit (HOSPITAL_BASED_OUTPATIENT_CLINIC_OR_DEPARTMENT_OTHER): Admitting: Physical Therapy

## 2023-11-09 DIAGNOSIS — M25511 Pain in right shoulder: Secondary | ICD-10-CM | POA: Diagnosis not present

## 2023-11-09 DIAGNOSIS — M6281 Muscle weakness (generalized): Secondary | ICD-10-CM

## 2023-11-09 DIAGNOSIS — R293 Abnormal posture: Secondary | ICD-10-CM

## 2023-11-09 DIAGNOSIS — M25611 Stiffness of right shoulder, not elsewhere classified: Secondary | ICD-10-CM
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
## 2023-11-12 ENCOUNTER — Encounter (HOSPITAL_BASED_OUTPATIENT_CLINIC_OR_DEPARTMENT_OTHER): Payer: Self-pay | Admitting: Physical Therapy

## 2023-11-12 ENCOUNTER — Ambulatory Visit (HOSPITAL_BASED_OUTPATIENT_CLINIC_OR_DEPARTMENT_OTHER): Admitting: Physical Therapy

## 2023-11-12 DIAGNOSIS — M25511 Pain in right shoulder: Secondary | ICD-10-CM | POA: Diagnosis not present

## 2023-11-12 DIAGNOSIS — M25611 Stiffness of right shoulder, not elsewhere classified: Secondary | ICD-10-CM

## 2023-11-12 DIAGNOSIS — M6281 Muscle weakness (generalized): Secondary | ICD-10-CM

## 2023-11-15 ENCOUNTER — Ambulatory Visit (HOSPITAL_BASED_OUTPATIENT_CLINIC_OR_DEPARTMENT_OTHER): Admitting: Orthopaedic Surgery

## 2023-11-15 DIAGNOSIS — M75101 Unspecified rotator cuff tear or rupture of right shoulder, not specified as traumatic: Secondary | ICD-10-CM
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
## 2023-11-17 ENCOUNTER — Ambulatory Visit (HOSPITAL_BASED_OUTPATIENT_CLINIC_OR_DEPARTMENT_OTHER): Attending: Orthopaedic Surgery | Admitting: Physical Therapy

## 2023-11-17 ENCOUNTER — Encounter (HOSPITAL_BASED_OUTPATIENT_CLINIC_OR_DEPARTMENT_OTHER): Payer: Self-pay | Admitting: Physical Therapy

## 2023-11-17 DIAGNOSIS — M25611 Stiffness of right shoulder, not elsewhere classified: Secondary | ICD-10-CM | POA: Insufficient documentation

## 2023-11-17 DIAGNOSIS — R293 Abnormal posture: Secondary | ICD-10-CM | POA: Diagnosis present

## 2023-11-17 DIAGNOSIS — M25511 Pain in right shoulder: Secondary | ICD-10-CM | POA: Insufficient documentation

## 2023-11-17 DIAGNOSIS — M6281 Muscle weakness (generalized): Secondary | ICD-10-CM | POA: Insufficient documentation
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
## 2023-11-19 ENCOUNTER — Encounter: Payer: Self-pay | Admitting: Radiology

## 2023-11-19 ENCOUNTER — Ambulatory Visit (HOSPITAL_BASED_OUTPATIENT_CLINIC_OR_DEPARTMENT_OTHER): Admitting: Physical Therapy

## 2023-11-19 ENCOUNTER — Encounter (HOSPITAL_BASED_OUTPATIENT_CLINIC_OR_DEPARTMENT_OTHER): Payer: Self-pay | Admitting: Physical Therapy

## 2023-11-19 DIAGNOSIS — M25611 Stiffness of right shoulder, not elsewhere classified: Secondary | ICD-10-CM

## 2023-11-19 DIAGNOSIS — M25511 Pain in right shoulder: Secondary | ICD-10-CM | POA: Diagnosis not present

## 2023-11-19 DIAGNOSIS — M6281 Muscle weakness (generalized): Secondary | ICD-10-CM

## 2023-11-21 ENCOUNTER — Encounter (HOSPITAL_BASED_OUTPATIENT_CLINIC_OR_DEPARTMENT_OTHER): Payer: Self-pay | Admitting: Physical Therapy

## 2023-11-21 ENCOUNTER — Ambulatory Visit (HOSPITAL_BASED_OUTPATIENT_CLINIC_OR_DEPARTMENT_OTHER): Payer: Self-pay | Admitting: Physical Therapy

## 2023-11-21 DIAGNOSIS — M6281 Muscle weakness (generalized): Secondary | ICD-10-CM

## 2023-11-21 DIAGNOSIS — M25511 Pain in right shoulder: Secondary | ICD-10-CM | POA: Diagnosis not present

## 2023-11-21 DIAGNOSIS — M25611 Stiffness of right shoulder, not elsewhere classified: Secondary | ICD-10-CM
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
## 2023-11-29 ENCOUNTER — Ambulatory Visit (HOSPITAL_BASED_OUTPATIENT_CLINIC_OR_DEPARTMENT_OTHER): Admitting: Physical Therapy

## 2023-11-30 ENCOUNTER — Ambulatory Visit (HOSPITAL_BASED_OUTPATIENT_CLINIC_OR_DEPARTMENT_OTHER): Admitting: Physical Therapy

## 2023-11-30 ENCOUNTER — Encounter (HOSPITAL_BASED_OUTPATIENT_CLINIC_OR_DEPARTMENT_OTHER): Payer: Self-pay | Admitting: Physical Therapy

## 2023-11-30 DIAGNOSIS — M25511 Pain in right shoulder: Secondary | ICD-10-CM | POA: Diagnosis not present

## 2023-11-30 DIAGNOSIS — M25611 Stiffness of right shoulder, not elsewhere classified: Secondary | ICD-10-CM

## 2023-11-30 DIAGNOSIS — M6281 Muscle weakness (generalized): Secondary | ICD-10-CM

## 2023-11-30 DIAGNOSIS — R293 Abnormal posture: Secondary | ICD-10-CM
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
## 2023-12-01 ENCOUNTER — Encounter (HOSPITAL_BASED_OUTPATIENT_CLINIC_OR_DEPARTMENT_OTHER): Admitting: Physical Therapy

## 2023-12-03 ENCOUNTER — Encounter (HOSPITAL_BASED_OUTPATIENT_CLINIC_OR_DEPARTMENT_OTHER): Admitting: Physical Therapy

## 2023-12-05 ENCOUNTER — Encounter (HOSPITAL_BASED_OUTPATIENT_CLINIC_OR_DEPARTMENT_OTHER): Admitting: Physical Therapy

## 2023-12-10 ENCOUNTER — Encounter (HOSPITAL_BASED_OUTPATIENT_CLINIC_OR_DEPARTMENT_OTHER): Admitting: Physical Therapy

## 2023-12-12 ENCOUNTER — Ambulatory Visit (HOSPITAL_BASED_OUTPATIENT_CLINIC_OR_DEPARTMENT_OTHER): Admitting: Physical Therapy

## 2023-12-18 ENCOUNTER — Ambulatory Visit (HOSPITAL_BASED_OUTPATIENT_CLINIC_OR_DEPARTMENT_OTHER): Admitting: Physical Therapy

## 2023-12-18 ENCOUNTER — Encounter (HOSPITAL_BASED_OUTPATIENT_CLINIC_OR_DEPARTMENT_OTHER): Payer: Self-pay | Admitting: Physical Therapy

## 2023-12-18 DIAGNOSIS — M6281 Muscle weakness (generalized): Secondary | ICD-10-CM | POA: Insufficient documentation

## 2023-12-18 DIAGNOSIS — R293 Abnormal posture: Secondary | ICD-10-CM | POA: Diagnosis present

## 2023-12-18 DIAGNOSIS — M25511 Pain in right shoulder: Secondary | ICD-10-CM | POA: Diagnosis present

## 2023-12-18 DIAGNOSIS — M25611 Stiffness of right shoulder, not elsewhere classified: Secondary | ICD-10-CM | POA: Diagnosis present
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
## 2023-12-20 ENCOUNTER — Ambulatory Visit (HOSPITAL_BASED_OUTPATIENT_CLINIC_OR_DEPARTMENT_OTHER): Admitting: Physical Therapy

## 2023-12-20 ENCOUNTER — Encounter (HOSPITAL_BASED_OUTPATIENT_CLINIC_OR_DEPARTMENT_OTHER): Payer: Self-pay | Admitting: Physical Therapy

## 2023-12-20 DIAGNOSIS — M6281 Muscle weakness (generalized): Secondary | ICD-10-CM

## 2023-12-20 DIAGNOSIS — R293 Abnormal posture: Secondary | ICD-10-CM

## 2023-12-20 DIAGNOSIS — M25611 Stiffness of right shoulder, not elsewhere classified: Secondary | ICD-10-CM

## 2023-12-20 DIAGNOSIS — M25511 Pain in right shoulder: Secondary | ICD-10-CM
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
## 2023-12-25 ENCOUNTER — Ambulatory Visit (HOSPITAL_BASED_OUTPATIENT_CLINIC_OR_DEPARTMENT_OTHER): Payer: Self-pay | Admitting: Physical Therapy

## 2023-12-25 ENCOUNTER — Encounter (HOSPITAL_BASED_OUTPATIENT_CLINIC_OR_DEPARTMENT_OTHER): Payer: Self-pay | Admitting: Physical Therapy

## 2023-12-25 DIAGNOSIS — M25611 Stiffness of right shoulder, not elsewhere classified: Secondary | ICD-10-CM | POA: Diagnosis not present
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
## 2023-12-27 ENCOUNTER — Ambulatory Visit (HOSPITAL_BASED_OUTPATIENT_CLINIC_OR_DEPARTMENT_OTHER): Payer: Self-pay | Admitting: Physical Therapy

## 2023-12-27 ENCOUNTER — Ambulatory Visit (HOSPITAL_BASED_OUTPATIENT_CLINIC_OR_DEPARTMENT_OTHER): Admitting: Orthopaedic Surgery

## 2023-12-27 ENCOUNTER — Encounter (HOSPITAL_BASED_OUTPATIENT_CLINIC_OR_DEPARTMENT_OTHER): Payer: Self-pay | Admitting: Physical Therapy

## 2023-12-27 DIAGNOSIS — M25611 Stiffness of right shoulder, not elsewhere classified: Secondary | ICD-10-CM

## 2023-12-27 DIAGNOSIS — M75101 Unspecified rotator cuff tear or rupture of right shoulder, not specified as traumatic: Secondary | ICD-10-CM

## 2023-12-27 DIAGNOSIS — M25511 Pain in right shoulder: Secondary | ICD-10-CM

## 2023-12-27 DIAGNOSIS — M6281 Muscle weakness (generalized): Secondary | ICD-10-CM
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
## 2024-01-01 ENCOUNTER — Encounter (HOSPITAL_BASED_OUTPATIENT_CLINIC_OR_DEPARTMENT_OTHER): Payer: Self-pay | Admitting: Physical Therapy

## 2024-01-01 ENCOUNTER — Ambulatory Visit (HOSPITAL_BASED_OUTPATIENT_CLINIC_OR_DEPARTMENT_OTHER): Payer: Self-pay | Admitting: Physical Therapy

## 2024-01-01 DIAGNOSIS — M25611 Stiffness of right shoulder, not elsewhere classified: Secondary | ICD-10-CM | POA: Diagnosis not present
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
## 2024-01-04 ENCOUNTER — Encounter (HOSPITAL_BASED_OUTPATIENT_CLINIC_OR_DEPARTMENT_OTHER): Payer: Self-pay | Admitting: Physical Therapy

## 2024-01-04 ENCOUNTER — Ambulatory Visit (HOSPITAL_BASED_OUTPATIENT_CLINIC_OR_DEPARTMENT_OTHER): Payer: Self-pay | Admitting: Physical Therapy

## 2024-01-04 DIAGNOSIS — M25511 Pain in right shoulder: Secondary | ICD-10-CM

## 2024-01-04 DIAGNOSIS — M6281 Muscle weakness (generalized): Secondary | ICD-10-CM

## 2024-01-04 DIAGNOSIS — M25611 Stiffness of right shoulder, not elsewhere classified: Secondary | ICD-10-CM | POA: Diagnosis not present
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
## 2024-01-08 ENCOUNTER — Encounter (HOSPITAL_BASED_OUTPATIENT_CLINIC_OR_DEPARTMENT_OTHER): Payer: Self-pay | Admitting: Physical Therapy

## 2024-01-08 ENCOUNTER — Ambulatory Visit (HOSPITAL_BASED_OUTPATIENT_CLINIC_OR_DEPARTMENT_OTHER): Payer: Self-pay | Admitting: Physical Therapy

## 2024-01-08 DIAGNOSIS — M25511 Pain in right shoulder: Secondary | ICD-10-CM

## 2024-01-08 DIAGNOSIS — M6281 Muscle weakness (generalized): Secondary | ICD-10-CM

## 2024-01-08 DIAGNOSIS — M25611 Stiffness of right shoulder, not elsewhere classified: Secondary | ICD-10-CM
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
## 2024-01-11 ENCOUNTER — Ambulatory Visit (HOSPITAL_BASED_OUTPATIENT_CLINIC_OR_DEPARTMENT_OTHER): Payer: Self-pay | Admitting: Physical Therapy

## 2024-01-11 ENCOUNTER — Encounter (HOSPITAL_BASED_OUTPATIENT_CLINIC_OR_DEPARTMENT_OTHER): Payer: Self-pay | Admitting: Physical Therapy

## 2024-01-11 DIAGNOSIS — M25611 Stiffness of right shoulder, not elsewhere classified: Secondary | ICD-10-CM | POA: Diagnosis not present

## 2024-01-11 DIAGNOSIS — M6281 Muscle weakness (generalized): Secondary | ICD-10-CM

## 2024-01-11 DIAGNOSIS — M25511 Pain in right shoulder: Secondary | ICD-10-CM
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
## 2024-01-15 ENCOUNTER — Ambulatory Visit (HOSPITAL_BASED_OUTPATIENT_CLINIC_OR_DEPARTMENT_OTHER): Payer: Self-pay | Admitting: Physical Therapy

## 2024-01-18 ENCOUNTER — Ambulatory Visit (HOSPITAL_BASED_OUTPATIENT_CLINIC_OR_DEPARTMENT_OTHER): Payer: Self-pay | Admitting: Physical Therapy

## 2024-01-22 ENCOUNTER — Other Ambulatory Visit: Payer: Self-pay | Admitting: Physician Assistant

## 2024-01-22 ENCOUNTER — Ambulatory Visit (HOSPITAL_BASED_OUTPATIENT_CLINIC_OR_DEPARTMENT_OTHER): Payer: Self-pay | Attending: Orthopaedic Surgery | Admitting: Physical Therapy

## 2024-01-22 ENCOUNTER — Encounter (HOSPITAL_BASED_OUTPATIENT_CLINIC_OR_DEPARTMENT_OTHER): Payer: Self-pay | Admitting: Physical Therapy

## 2024-01-22 DIAGNOSIS — M25511 Pain in right shoulder: Secondary | ICD-10-CM | POA: Diagnosis present

## 2024-01-22 DIAGNOSIS — M25611 Stiffness of right shoulder, not elsewhere classified: Secondary | ICD-10-CM | POA: Diagnosis present

## 2024-01-22 DIAGNOSIS — M6281 Muscle weakness (generalized): Secondary | ICD-10-CM | POA: Diagnosis present
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
## 2024-01-25 ENCOUNTER — Ambulatory Visit (HOSPITAL_BASED_OUTPATIENT_CLINIC_OR_DEPARTMENT_OTHER): Payer: Self-pay | Admitting: Physical Therapy

## 2024-01-25 ENCOUNTER — Encounter (HOSPITAL_BASED_OUTPATIENT_CLINIC_OR_DEPARTMENT_OTHER): Payer: Self-pay | Admitting: Physical Therapy

## 2024-01-25 DIAGNOSIS — M6281 Muscle weakness (generalized): Secondary | ICD-10-CM

## 2024-01-25 DIAGNOSIS — M25611 Stiffness of right shoulder, not elsewhere classified: Secondary | ICD-10-CM | POA: Diagnosis not present

## 2024-01-25 DIAGNOSIS — M25511 Pain in right shoulder: Secondary | ICD-10-CM
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
## 2024-01-29 ENCOUNTER — Ambulatory Visit (HOSPITAL_BASED_OUTPATIENT_CLINIC_OR_DEPARTMENT_OTHER): Payer: Self-pay | Admitting: Physical Therapy

## 2024-02-01 ENCOUNTER — Encounter (HOSPITAL_BASED_OUTPATIENT_CLINIC_OR_DEPARTMENT_OTHER): Payer: Self-pay | Admitting: Physical Therapy

## 2024-02-05 ENCOUNTER — Ambulatory Visit (HOSPITAL_BASED_OUTPATIENT_CLINIC_OR_DEPARTMENT_OTHER): Payer: Self-pay | Admitting: Physical Therapy

## 2024-02-08 ENCOUNTER — Encounter (HOSPITAL_BASED_OUTPATIENT_CLINIC_OR_DEPARTMENT_OTHER): Payer: Self-pay | Admitting: Physical Therapy

## 2024-02-12 ENCOUNTER — Ambulatory Visit (HOSPITAL_BASED_OUTPATIENT_CLINIC_OR_DEPARTMENT_OTHER): Payer: Self-pay | Admitting: Physical Therapy

## 2024-02-15 ENCOUNTER — Encounter (HOSPITAL_BASED_OUTPATIENT_CLINIC_OR_DEPARTMENT_OTHER): Payer: Self-pay | Admitting: Physical Therapy

## 2024-03-27 ENCOUNTER — Ambulatory Visit (HOSPITAL_BASED_OUTPATIENT_CLINIC_OR_DEPARTMENT_OTHER): Admitting: Orthopaedic Surgery
